# Patient Record
Sex: Female | Born: 1940 | Race: White | Hispanic: No | Marital: Married | Smoking: Never smoker
Health system: Southern US, Community
[De-identification: ages and names within clinical notes are randomized; demographics above are authoritative.]

## PROBLEM LIST (undated history)

## (undated) DIAGNOSIS — H269 Unspecified cataract: Secondary | ICD-10-CM

## (undated) HISTORY — PX: TRACHEOSTOMY: SUR1362

## (undated) HISTORY — PX: BREAST SURGERY: SHX581

## (undated) HISTORY — PX: ABDOMINAL HYSTERECTOMY: SHX81

---

## 2001-07-06 ENCOUNTER — Emergency Department (HOSPITAL_COMMUNITY): Admission: EM | Admit: 2001-07-06 | Discharge: 2001-07-06 | Payer: Self-pay | Admitting: *Deleted

## 2001-07-06 ENCOUNTER — Encounter: Payer: Self-pay | Admitting: *Deleted

## 2013-06-17 ENCOUNTER — Encounter (HOSPITAL_COMMUNITY): Payer: Self-pay | Admitting: Pharmacy Technician

## 2013-06-17 NOTE — Patient Instructions (Addendum)
Your procedure is scheduled on: 06/20/2013  Report to [facility] at  1000 AM.  Call this number if you have problems the morning of surgery: [phone]   Do not eat food or drink liquids :After Midnight.      Take these medicines the morning of surgery with A SIP OF WATER: none   Do not wear jewelry, make-up or nail polish.  Do not wear lotions, powders, or perfumes.   Do not shave 48 hours prior to surgery.  Do not bring valuables to the hospital.  Contacts, dentures or bridgework may not be worn into surgery.  Leave suitcase in the car. After surgery it may be brought to your room.  For patients admitted to the hospital, checkout time is 11:00 AM the day of discharge.   Patients discharged the day of surgery will not be allowed to drive home.  :     Please read over the following fact sheets that you were given: Coughing and Deep Breathing, Surgical Site Infection Prevention, Anesthesia Post-op Instructions and Care and Recovery After Surgery    Cataract A cataract is a clouding of the lens of the eye. When a lens becomes cloudy, vision is reduced based on the degree and nature of the clouding. Many cataracts reduce vision to some degree. Some cataracts make people more near-sighted as they develop. Other cataracts increase glare. Cataracts that are ignored and become worse can sometimes look white. The white color can be seen through the pupil. CAUSES   Aging. However, cataracts may occur at any age, even in newborns.   Certain drugs.   Trauma to the eye.   Certain diseases such as diabetes.   Specific eye diseases such as chronic inflammation inside the eye or a sudden attack of a rare form of glaucoma.   Inherited or acquired medical problems.  SYMPTOMS   Gradual, progressive drop in vision in the affected eye.   Severe, rapid visual loss. This most often happens when trauma is the cause.  DIAGNOSIS  To detect a cataract, an eye doctor examines the lens. Cataracts are  best diagnosed with an exam of the eyes with the pupils enlarged (dilated) by drops.  TREATMENT  For an early cataract, vision may improve by using different eyeglasses or stronger lighting. If that does not help your vision, surgery is the only effective treatment. A cataract needs to be surgically removed when vision loss interferes with your everyday activities, such as driving, reading, or watching TV. A cataract may also have to be removed if it prevents examination or treatment of another eye problem. Surgery removes the cloudy lens and usually replaces it with a substitute lens (intraocular lens, IOL).  At a time when both you and your doctor agree, the cataract will be surgically removed. If you have cataracts in both eyes, only one is usually removed at a time. This allows the operated eye to heal and be out of danger from any possible problems after surgery (such as infection or poor wound healing). In rare cases, a cataract may be doing damage to your eye. In these cases, your caregiver may advise surgical removal right away. The vast majority of people who have cataract surgery have better vision afterward. HOME CARE INSTRUCTIONS  If you are not planning surgery, you may be asked to do the following:  Use different eyeglasses.   Use stronger or brighter lighting.   Ask your eye doctor about reducing your medicine dose or changing medicines if it  is thought that a medicine caused your cataract. Changing medicines does not make the cataract go away on its own.   Become familiar with your surroundings. Poor vision can lead to injury. Avoid bumping into things on the affected side. You are at a higher risk for tripping or falling.   Exercise extreme care when driving or operating machinery.   Wear sunglasses if you are sensitive to bright light or experiencing problems with glare.  SEEK IMMEDIATE MEDICAL CARE IF:   You have a worsening or sudden vision loss.   You notice redness,  swelling, or increasing pain in the eye.   You have a fever.  Document Released: 12/05/2005 Document Revised: 11/24/2011 Document Reviewed: 07/29/2011 [facility] Patient Information 2012 [place].PATIENT INSTRUCTIONS POST-ANESTHESIA  IMMEDIATELY FOLLOWING SURGERY:  Do not drive or operate machinery for the first twenty four hours after surgery.  Do not make any important decisions for twenty four hours after surgery or while taking narcotic pain medications or sedatives.  If you develop intractable nausea and vomiting or a severe headache please notify your doctor immediately.  FOLLOW-UP:  Please make an appointment with your surgeon as instructed. You do not need to follow up with anesthesia unless specifically instructed to do so.  WOUND CARE INSTRUCTIONS (if applicable):  Keep a dry clean dressing on the anesthesia/puncture wound site if there is drainage.  Once the wound has quit draining you may leave it open to air.  Generally you should leave the bandage intact for twenty four hours unless there is drainage.  If the epidural site drains for more than 36-48 hours please call the anesthesia department.  QUESTIONS?:  Please feel free to call your physician or the hospital operator if you have any questions, and they will be happy to assist you.

## 2013-06-18 ENCOUNTER — Encounter (HOSPITAL_COMMUNITY): Payer: Self-pay

## 2013-06-18 ENCOUNTER — Encounter (HOSPITAL_COMMUNITY)
Admission: RE | Admit: 2013-06-18 | Discharge: 2013-06-18 | Disposition: A | Discharge status: Home / Self Care | Payer: Medicare Other | Source: Ambulatory Visit | Attending: Ophthalmology | Admitting: Ophthalmology

## 2013-06-18 HISTORY — DX: Unspecified cataract: H26.9

## 2013-06-18 LAB — BASIC METABOLIC PANEL
BUN: 17 mg/dL (ref 6–23)
CO2: 31 mEq/L (ref 19–32)
Calcium: 9.7 mg/dL (ref 8.4–10.5)
Chloride: 104 mEq/L (ref 96–112)
Creatinine, Ser: 0.79 mg/dL (ref 0.50–1.10)
GFR calc Af Amer: >90 mL/min (ref >90)
GFR calc non Af Amer: 81 mL/min — ABNORMAL LOW (ref >90)
Glucose, Bld: 119 mg/dL — ABNORMAL HIGH (ref 70–99)
Potassium: 4.2 mEq/L (ref 3.5–5.1)
Sodium: 143 mEq/L (ref 135–145)

## 2013-06-18 LAB — HEMOGLOBIN AND HEMATOCRIT, BLOOD
HCT: 40.3 % (ref 36.0–46.0)
Hemoglobin: 13.4 g/dL (ref 12.0–15.0)

## 2013-06-19 MED ORDER — TETRACAINE HCL 0.5 % OP SOLN
OPHTHALMIC | Status: AC
  Filled 2013-06-19: qty 2

## 2013-06-19 MED ORDER — LIDOCAINE HCL (PF) 1 % IJ SOLN
INTRAMUSCULAR | Status: AC
  Filled 2013-06-19: qty 2

## 2013-06-19 MED ORDER — CYCLOPENTOLATE-PHENYLEPHRINE 0.2-1 % OP SOLN
OPHTHALMIC | Status: AC
  Filled 2013-06-19: qty 2

## 2013-06-19 MED ORDER — NEOMYCIN-POLYMYXIN-DEXAMETH 3.5-10000-0.1 OP OINT
TOPICAL_OINTMENT | OPHTHALMIC | Status: AC
  Filled 2013-06-19: qty 3.5

## 2013-06-19 MED ORDER — LIDOCAINE HCL 3.5 % OP GEL
OPHTHALMIC | Status: AC
  Filled 2013-06-19: qty 5

## 2013-06-20 ENCOUNTER — Encounter (HOSPITAL_COMMUNITY)
Admission: RE | Disposition: A | Discharge status: Home / Self Care | Payer: Self-pay | Source: Ambulatory Visit | Attending: Ophthalmology

## 2013-06-20 ENCOUNTER — Ambulatory Visit (HOSPITAL_COMMUNITY): Payer: Medicare Other | Admitting: Anesthesiology

## 2013-06-20 ENCOUNTER — Encounter (HOSPITAL_COMMUNITY): Payer: Self-pay | Admitting: *Deleted

## 2013-06-20 ENCOUNTER — Ambulatory Visit (HOSPITAL_COMMUNITY)
Admission: RE | Admit: 2013-06-20 | Discharge: 2013-06-20 | Disposition: A | Discharge status: Home / Self Care | Payer: Medicare Other | Source: Ambulatory Visit | Attending: Ophthalmology | Admitting: Ophthalmology

## 2013-06-20 ENCOUNTER — Encounter (HOSPITAL_COMMUNITY): Payer: Self-pay | Admitting: Anesthesiology

## 2013-06-20 DIAGNOSIS — Z01810 Encounter for preprocedural cardiovascular examination: Secondary | ICD-10-CM | POA: Insufficient documentation

## 2013-06-20 DIAGNOSIS — H2510 Age-related nuclear cataract, unspecified eye: Secondary | ICD-10-CM | POA: Insufficient documentation

## 2013-06-20 DIAGNOSIS — Z01812 Encounter for preprocedural laboratory examination: Secondary | ICD-10-CM | POA: Insufficient documentation

## 2013-06-20 HISTORY — PX: CATARACT EXTRACTION W/PHACO: SHX586

## 2013-06-20 SURGERY — PHACOEMULSIFICATION, CATARACT, WITH IOL INSERTION
Anesthesia: Monitor Anesthesia Care | Site: Eye | Laterality: Right | Wound class: Clean

## 2013-06-20 MED ORDER — PHENYLEPHRINE HCL 2.5 % OP SOLN
OPHTHALMIC | Status: AC
  Filled 2013-06-20: qty 15

## 2013-06-20 MED ORDER — EPINEPHRINE HCL 1 MG/ML IJ SOLN
INTRAOCULAR | Status: DC | PRN
  Administered 2013-06-20: 11:00:00

## 2013-06-20 MED ORDER — NEOMYCIN-POLYMYXIN-DEXAMETH 0.1 % OP OINT
TOPICAL_OINTMENT | OPHTHALMIC | Status: DC | PRN
  Administered 2013-06-20: 1 via OPHTHALMIC

## 2013-06-20 MED ORDER — LACTATED RINGERS IV SOLN
INTRAVENOUS | Status: DC
  Administered 2013-06-20: 1000 mL via INTRAVENOUS

## 2013-06-20 MED ORDER — PROVISC 10 MG/ML IO SOLN
INTRAOCULAR | Status: DC | PRN
  Administered 2013-06-20: 8.5 mg via INTRAOCULAR

## 2013-06-20 MED ORDER — PHENYLEPHRINE HCL 2.5 % OP SOLN
1.0000 [drp] | OPHTHALMIC | Status: AC
  Administered 2013-06-20 (×3): 1 [drp] via OPHTHALMIC

## 2013-06-20 MED ORDER — EPINEPHRINE HCL 1 MG/ML IJ SOLN
INTRAMUSCULAR | Status: AC
  Filled 2013-06-20: qty 1

## 2013-06-20 MED ORDER — MIDAZOLAM HCL 2 MG/2ML IJ SOLN
INTRAMUSCULAR | Status: AC
  Filled 2013-06-20: qty 2

## 2013-06-20 MED ORDER — MIDAZOLAM HCL 2 MG/2ML IJ SOLN
1.0000 mg | INTRAMUSCULAR | Status: DC | PRN
  Administered 2013-06-20: 2 mg via INTRAVENOUS

## 2013-06-20 MED ORDER — LIDOCAINE HCL 3.5 % OP GEL
1.0000 "application " | Freq: Once | OPHTHALMIC | Status: AC
  Administered 2013-06-20: 1 via OPHTHALMIC

## 2013-06-20 MED ORDER — BSS IO SOLN
INTRAOCULAR | Status: DC | PRN
  Administered 2013-06-20: 15 mL via INTRAOCULAR

## 2013-06-20 MED ORDER — CYCLOPENTOLATE-PHENYLEPHRINE 0.2-1 % OP SOLN
1.0000 [drp] | OPHTHALMIC | Status: AC
  Administered 2013-06-20 (×3): 1 [drp] via OPHTHALMIC

## 2013-06-20 MED ORDER — LIDOCAINE HCL (PF) 1 % IJ SOLN
INTRAMUSCULAR | Status: DC | PRN
  Administered 2013-06-20: .4 mL

## 2013-06-20 MED ORDER — POVIDONE-IODINE 5 % OP SOLN
OPHTHALMIC | Status: DC | PRN
  Administered 2013-06-20: 1 via OPHTHALMIC

## 2013-06-20 MED ORDER — TETRACAINE HCL 0.5 % OP SOLN
1.0000 [drp] | OPHTHALMIC | Status: AC
  Administered 2013-06-20 (×3): 1 [drp] via OPHTHALMIC

## 2013-06-20 SURGICAL SUPPLY — 32 items
CAPSULAR TENSION RING-AMO (OPHTHALMIC RELATED) IMPLANT
CLOTH BEACON ORANGE TIMEOUT ST (SAFETY) ×2 IMPLANT
EYE SHIELD UNIVERSAL CLEAR (GAUZE/BANDAGES/DRESSINGS) ×2 IMPLANT
GLOVE BIO SURGEON STRL SZ 6.5 (GLOVE) IMPLANT
GLOVE BIOGEL PI IND STRL 6.5 (GLOVE) IMPLANT
GLOVE BIOGEL PI IND STRL 7.0 (GLOVE) ×2 IMPLANT
GLOVE BIOGEL PI IND STRL 7.5 (GLOVE) IMPLANT
GLOVE BIOGEL PI INDICATOR 6.5 (GLOVE)
GLOVE BIOGEL PI INDICATOR 7.0 (GLOVE) ×2
GLOVE BIOGEL PI INDICATOR 7.5 (GLOVE)
GLOVE ECLIPSE 6.5 STRL STRAW (GLOVE) IMPLANT
GLOVE ECLIPSE 7.0 STRL STRAW (GLOVE) IMPLANT
GLOVE ECLIPSE 7.5 STRL STRAW (GLOVE) IMPLANT
GLOVE EXAM NITRILE LRG STRL (GLOVE) IMPLANT
GLOVE EXAM NITRILE MD LF STRL (GLOVE) IMPLANT
GLOVE SKINSENSE NS SZ6.5 (GLOVE)
GLOVE SKINSENSE NS SZ7.0 (GLOVE)
GLOVE SKINSENSE STRL SZ6.5 (GLOVE) IMPLANT
GLOVE SKINSENSE STRL SZ7.0 (GLOVE) IMPLANT
KIT VITRECTOMY (OPHTHALMIC RELATED) IMPLANT
PAD ARMBOARD 7.5X6 YLW CONV (MISCELLANEOUS) ×2 IMPLANT
PROC W NO LENS (INTRAOCULAR LENS)
PROC W SPEC LENS (INTRAOCULAR LENS)
PROCESS W NO LENS (INTRAOCULAR LENS) IMPLANT
PROCESS W SPEC LENS (INTRAOCULAR LENS) IMPLANT
RING MALYGIN (MISCELLANEOUS) IMPLANT
SIGHTPATH CAT PROC W REG LENS (Ophthalmic Related) ×2 IMPLANT
SYR TB 1ML LL NO SAFETY (SYRINGE) ×2 IMPLANT
TAPE SURG TRANSPORE 1 IN (GAUZE/BANDAGES/DRESSINGS) ×1 IMPLANT
TAPE SURGICAL TRANSPORE 1 IN (GAUZE/BANDAGES/DRESSINGS) ×1
VISCOELASTIC ADDITIONAL (OPHTHALMIC RELATED) IMPLANT
WATER STERILE IRR 250ML POUR (IV SOLUTION) ×2 IMPLANT

## 2013-06-20 NOTE — Anesthesia Postprocedure Evaluation (Signed)
  Anesthesia Post-op Note  Patient: Rachael Rivas  Procedure(s) Performed: Procedure(s) with comments: CATARACT EXTRACTION PHACO AND INTRAOCULAR LENS PLACEMENT (IOC) (Right) - CDE: 16.48  Patient Location: PACU and Short Stay  Anesthesia Type:MAC  Level of Consciousness: awake, alert  and oriented  Airway and Oxygen Therapy: Patient Spontanous Breathing  Post-op Pain: none  Post-op Assessment: Post-op Vital signs reviewed, Patient's Cardiovascular Status Stable, Respiratory Function Stable, Patent Airway and No signs of Nausea or vomiting  Post-op Vital Signs: Reviewed and stable  Complications: No apparent anesthesia complications

## 2013-06-20 NOTE — Transfer of Care (Signed)
Immediate Anesthesia Transfer of Care Note  Patient: Rachael Rivas  Procedure(s) Performed: Procedure(s) with comments: CATARACT EXTRACTION PHACO AND INTRAOCULAR LENS PLACEMENT (IOC) (Right) - CDE: 16.48  Patient Location: PACU and Short Stay  Anesthesia Type:MAC  Level of Consciousness: awake, alert  and oriented  Airway & Oxygen Therapy: Patient Spontanous Breathing  Post-op Assessment: Report given to PACU RN  Post vital signs: Reviewed  Complications: No apparent anesthesia complications

## 2013-06-20 NOTE — Op Note (Signed)
Date of Admission: 06/20/2013  Date of Surgery: 06/20/2013  Pre-Op Dx: Cataract  Right  Eye  Post-Op Dx: Nuclear Cataract  Right  Eye,  Dx Code 366.16  Surgeon: Gemma Payor, M.D.  Assistants: None  Anesthesia: Topical with MAC  Indications: Painless, progressive loss of vision with compromise of daily activities.  Surgery: Cataract Extraction with Intraocular lens Implant Right Eye  Discription: The patient had dilating drops and viscous lidocaine placed into the left eye in the pre-op holding area. After transfer to the operating room, a time out was performed. The patient was then prepped and draped. Beginning with a 75 degree blade a paracentesis port was made at the surgeon's 2 o'clock position. The anterior chamber was then filled with 1% non-preserved lidocaine. This was followed by filling the anterior chamber with Provisc. A bent cystatome needle was used to create a continuous tear capsulotomy. Hydrodissection was performed with balanced salt solution on a Fine canula. The lens nucleus was then removed using the phacoemulsification handpiece. Residual cortex was removed with the I&A handpiece. The anterior chamber and capsular bag were refilled with Provisc. A posterior chamber intraocular lens was placed into the capsular bag with it's injector. The implant was positioned with the Kuglan hook. The Provisc was then removed from the anterior chamber and capsular bag with the I&A handpiece. Stromal hydration of the main incision and paracentesis port was performed with BSS on a Fine canula. The wounds were tested for leak which was negative. The patient tolerated the procedure well. There were no operative complications. The patient was then transferred to the recovery room in stable condition.  Complications: None  Specimen: None  EBL: None  Prosthetic device: B&L enVista, MX60, power 18.0D, #[id number].

## 2013-06-20 NOTE — H&P (Signed)
I have reviewed the H&P, the patient was re-examined, and I have identified no interval changes in medical condition and plan of care since the history and physical of record  

## 2013-06-20 NOTE — Anesthesia Preprocedure Evaluation (Signed)
Anesthesia Evaluation  Patient identified by MRN, date of birth, ID band Patient awake    Reviewed: Allergy & Precautions, H&P , NPO status , Patient's Chart, lab work & pertinent test results  Airway Mallampati: II      Dental  (+) Teeth Intact   Pulmonary neg pulmonary ROS,  breath sounds clear to auscultation        Cardiovascular negative cardio ROS  Rhythm:Regular Rate:Normal     Neuro/Psych    GI/Hepatic negative GI ROS,   Endo/Other    Renal/GU      Musculoskeletal   Abdominal   Peds  Hematology   Anesthesia Other Findings   Reproductive/Obstetrics                           Anesthesia Physical Anesthesia Plan  ASA: I  Anesthesia Plan: MAC   Post-op Pain Management:    Induction: Intravenous  Airway Management Planned: Nasal Cannula  Additional Equipment:   Intra-op Plan:   Post-operative Plan:   Informed Consent: I have reviewed the patients History and Physical, chart, labs and discussed the procedure including the risks, benefits and alternatives for the proposed anesthesia with the patient or authorized representative who has indicated his/her understanding and acceptance.     Plan Discussed with:   Anesthesia Plan Comments:         Anesthesia Quick Evaluation  

## 2013-06-24 ENCOUNTER — Encounter (HOSPITAL_COMMUNITY): Payer: Self-pay | Admitting: Ophthalmology

## 2014-05-21 ENCOUNTER — Encounter (HOSPITAL_COMMUNITY): Payer: Self-pay

## 2014-05-21 NOTE — Patient Instructions (Signed)
Your procedure is scheduled on: 6/8/215  Report to [facility] at   1:00   PM.  Call this number if you have problems the morning of surgery: [phone]   Do not eat food or drink liquids :After Midnight.      Take these medicines the morning of surgery with A SIP OF WATER: none   Do not wear jewelry, make-up or nail polish.  Do not wear lotions, powders, or perfumes.   Do not shave 48 hours prior to surgery.  Do not bring valuables to the hospital.  Contacts, dentures or bridgework may not be worn into surgery.  Leave suitcase in the car. After surgery it may be brought to your room.  For patients admitted to the hospital, checkout time is 11:00 AM the day of discharge.   Patients discharged the day of surgery will not be allowed to drive home.  :     Please read over the following fact sheets that you were given: Coughing and Deep Breathing, Surgical Site Infection Prevention, Anesthesia Post-op Instructions and Care and Recovery After Surgery    Cataract A cataract is a clouding of the lens of the eye. When a lens becomes cloudy, vision is reduced based on the degree and nature of the clouding. Many cataracts reduce vision to some degree. Some cataracts make people more near-sighted as they develop. Other cataracts increase glare. Cataracts that are ignored and become worse can sometimes look white. The white color can be seen through the pupil. CAUSES   Aging. However, cataracts may occur at any age, even in newborns.   Certain drugs.   Trauma to the eye.   Certain diseases such as diabetes.   Specific eye diseases such as chronic inflammation inside the eye or a sudden attack of a rare form of glaucoma.   Inherited or acquired medical problems.  SYMPTOMS   Gradual, progressive drop in vision in the affected eye.   Severe, rapid visual loss. This most often happens when trauma is the cause.  DIAGNOSIS  To detect a cataract, an eye doctor examines the lens. Cataracts are  best diagnosed with an exam of the eyes with the pupils enlarged (dilated) by drops.  TREATMENT  For an early cataract, vision may improve by using different eyeglasses or stronger lighting. If that does not help your vision, surgery is the only effective treatment. A cataract needs to be surgically removed when vision loss interferes with your everyday activities, such as driving, reading, or watching TV. A cataract may also have to be removed if it prevents examination or treatment of another eye problem. Surgery removes the cloudy lens and usually replaces it with a substitute lens (intraocular lens, IOL).  At a time when both you and your doctor agree, the cataract will be surgically removed. If you have cataracts in both eyes, only one is usually removed at a time. This allows the operated eye to heal and be out of danger from any possible problems after surgery (such as infection or poor wound healing). In rare cases, a cataract may be doing damage to your eye. In these cases, your caregiver may advise surgical removal right away. The vast majority of people who have cataract surgery have better vision afterward. HOME CARE INSTRUCTIONS  If you are not planning surgery, you may be asked to do the following:  Use different eyeglasses.   Use stronger or brighter lighting.   Ask your eye doctor about reducing your medicine dose or changing  medicines if it is thought that a medicine caused your cataract. Changing medicines does not make the cataract go away on its own.   Become familiar with your surroundings. Poor vision can lead to injury. Avoid bumping into things on the affected side. You are at a higher risk for tripping or falling.   Exercise extreme care when driving or operating machinery.   Wear sunglasses if you are sensitive to bright light or experiencing problems with glare.  SEEK IMMEDIATE MEDICAL CARE IF:   You have a worsening or sudden vision loss.   You notice redness,  swelling, or increasing pain in the eye.   You have a fever.  Document Released: 12/05/2005 Document Revised: 11/24/2011 Document Reviewed: 07/29/2011 [facility] Patient Information 2012 [place].PATIENT INSTRUCTIONS POST-ANESTHESIA  IMMEDIATELY FOLLOWING SURGERY:  Do not drive or operate machinery for the first twenty four hours after surgery.  Do not make any important decisions for twenty four hours after surgery or while taking narcotic pain medications or sedatives.  If you develop intractable nausea and vomiting or a severe headache please notify your doctor immediately.  FOLLOW-UP:  Please make an appointment with your surgeon as instructed. You do not need to follow up with anesthesia unless specifically instructed to do so.  WOUND CARE INSTRUCTIONS (if applicable):  Keep a dry clean dressing on the anesthesia/puncture wound site if there is drainage.  Once the wound has quit draining you may leave it open to air.  Generally you should leave the bandage intact for twenty four hours unless there is drainage.  If the epidural site drains for more than 36-48 hours please call the anesthesia department.  QUESTIONS?:  Please feel free to call your physician or the hospital operator if you have any questions, and they will be happy to assist you.

## 2014-05-22 ENCOUNTER — Encounter (HOSPITAL_COMMUNITY): Payer: Self-pay

## 2014-05-22 ENCOUNTER — Other Ambulatory Visit: Payer: Self-pay

## 2014-05-22 ENCOUNTER — Encounter (HOSPITAL_COMMUNITY)
Admission: RE | Admit: 2014-05-22 | Discharge: 2014-05-22 | Disposition: A | Discharge status: Home / Self Care | Payer: Medicare Other | Source: Ambulatory Visit | Attending: Ophthalmology | Admitting: Ophthalmology

## 2014-05-22 DIAGNOSIS — Z01810 Encounter for preprocedural cardiovascular examination: Secondary | ICD-10-CM | POA: Insufficient documentation

## 2014-05-22 DIAGNOSIS — Z01812 Encounter for preprocedural laboratory examination: Secondary | ICD-10-CM | POA: Insufficient documentation

## 2014-05-22 LAB — BASIC METABOLIC PANEL
BUN: 22 mg/dL (ref 6–23)
CALCIUM: 9.4 mg/dL (ref 8.4–10.5)
CHLORIDE: 103 meq/L (ref 96–112)
CO2: 28 mEq/L (ref 19–32)
CREATININE: 0.85 mg/dL (ref 0.50–1.10)
GFR calc non Af Amer: 66 mL/min — ABNORMAL LOW (ref >90)
GFR, EST AFRICAN AMERICAN: 77 mL/min — AB (ref >90)
Glucose, Bld: 98 mg/dL (ref 70–99)
Potassium: 4.5 mEq/L (ref 3.7–5.3)
Sodium: 143 mEq/L (ref 137–147)

## 2014-05-22 LAB — HEMOGLOBIN AND HEMATOCRIT, BLOOD
HCT: 39.2 % (ref 36.0–46.0)
HEMOGLOBIN: 13.1 g/dL (ref 12.0–15.0)

## 2014-05-22 NOTE — Pre-Procedure Instructions (Signed)
Pateint given information to sign up for my chart at home. 

## 2014-05-23 MED ORDER — TETRACAINE HCL 0.5 % OP SOLN
OPHTHALMIC | Status: AC
  Filled 2014-05-23: qty 2

## 2014-05-23 MED ORDER — LIDOCAINE HCL (PF) 1 % IJ SOLN
INTRAMUSCULAR | Status: AC
  Filled 2014-05-23: qty 2

## 2014-05-23 MED ORDER — PHENYLEPHRINE HCL 2.5 % OP SOLN
OPHTHALMIC | Status: AC
  Filled 2014-05-23: qty 15

## 2014-05-23 MED ORDER — CYCLOPENTOLATE-PHENYLEPHRINE OP SOLN OPTIME - NO CHARGE
OPHTHALMIC | Status: AC
  Filled 2014-05-23: qty 2

## 2014-05-23 MED ORDER — LIDOCAINE HCL 3.5 % OP GEL
OPHTHALMIC | Status: AC
  Filled 2014-05-23: qty 1

## 2014-05-23 MED ORDER — NEOMYCIN-POLYMYXIN-DEXAMETH 3.5-10000-0.1 OP SUSP
OPHTHALMIC | Status: AC
  Filled 2014-05-23: qty 5

## 2014-05-26 ENCOUNTER — Ambulatory Visit (HOSPITAL_COMMUNITY)
Admission: RE | Admit: 2014-05-26 | Discharge: 2014-05-26 | Disposition: A | Discharge status: Home / Self Care | Payer: Medicare Other | Source: Ambulatory Visit | Attending: Ophthalmology | Admitting: Ophthalmology

## 2014-05-26 ENCOUNTER — Ambulatory Visit (HOSPITAL_COMMUNITY): Payer: Medicare Other | Admitting: Anesthesiology

## 2014-05-26 ENCOUNTER — Encounter (HOSPITAL_COMMUNITY)
Admission: RE | Disposition: A | Discharge status: Home / Self Care | Payer: Self-pay | Source: Ambulatory Visit | Attending: Ophthalmology

## 2014-05-26 ENCOUNTER — Encounter (HOSPITAL_COMMUNITY): Payer: Self-pay

## 2014-05-26 ENCOUNTER — Encounter (HOSPITAL_COMMUNITY): Payer: Medicare Other | Admitting: Anesthesiology

## 2014-05-26 DIAGNOSIS — H2510 Age-related nuclear cataract, unspecified eye: Secondary | ICD-10-CM | POA: Insufficient documentation

## 2014-05-26 HISTORY — PX: CATARACT EXTRACTION W/PHACO: SHX586

## 2014-05-26 SURGERY — PHACOEMULSIFICATION, CATARACT, WITH IOL INSERTION
Anesthesia: Monitor Anesthesia Care | Site: Eye | Laterality: Left

## 2014-05-26 MED ORDER — MIDAZOLAM HCL 2 MG/2ML IJ SOLN
1.0000 mg | INTRAMUSCULAR | Status: DC | PRN
  Administered 2014-05-26: 2 mg via INTRAVENOUS

## 2014-05-26 MED ORDER — EPINEPHRINE HCL 1 MG/ML IJ SOLN
INTRAOCULAR | Status: DC | PRN
  Administered 2014-05-26: 11:00:00

## 2014-05-26 MED ORDER — LIDOCAINE HCL 3.5 % OP GEL
1.0000 "application " | Freq: Once | OPHTHALMIC | Status: AC
  Administered 2014-05-26: 1 via OPHTHALMIC

## 2014-05-26 MED ORDER — LIDOCAINE HCL (PF) 1 % IJ SOLN
INTRAMUSCULAR | Status: DC | PRN
  Administered 2014-05-26: .5 mL

## 2014-05-26 MED ORDER — LIDOCAINE 3.5 % OP GEL OPTIME - NO CHARGE
OPHTHALMIC | Status: DC | PRN
  Administered 2014-05-26: 2 [drp] via OPHTHALMIC

## 2014-05-26 MED ORDER — MIDAZOLAM HCL 2 MG/2ML IJ SOLN
INTRAMUSCULAR | Status: AC
  Filled 2014-05-26: qty 2

## 2014-05-26 MED ORDER — PROVISC 10 MG/ML IO SOLN
INTRAOCULAR | Status: DC | PRN
  Administered 2014-05-26: 0.85 mL via INTRAOCULAR

## 2014-05-26 MED ORDER — NEOMYCIN-POLYMYXIN-DEXAMETH 3.5-10000-0.1 OP SUSP
OPHTHALMIC | Status: DC | PRN
  Administered 2014-05-26: 2 [drp] via OPHTHALMIC

## 2014-05-26 MED ORDER — FENTANYL CITRATE 0.05 MG/ML IJ SOLN
25.0000 ug | INTRAMUSCULAR | Status: AC
  Administered 2014-05-26 (×2): 25 ug via INTRAVENOUS

## 2014-05-26 MED ORDER — PHENYLEPHRINE HCL 2.5 % OP SOLN
1.0000 [drp] | OPHTHALMIC | Status: AC
  Administered 2014-05-26 (×3): 1 [drp] via OPHTHALMIC

## 2014-05-26 MED ORDER — EPINEPHRINE HCL 1 MG/ML IJ SOLN
INTRAMUSCULAR | Status: AC
  Filled 2014-05-26: qty 1

## 2014-05-26 MED ORDER — POVIDONE-IODINE 5 % OP SOLN
OPHTHALMIC | Status: DC | PRN
  Administered 2014-05-26: 1 via OPHTHALMIC

## 2014-05-26 MED ORDER — LACTATED RINGERS IV SOLN
INTRAVENOUS | Status: DC
  Administered 2014-05-26: 10:00:00 via INTRAVENOUS

## 2014-05-26 MED ORDER — FENTANYL CITRATE 0.05 MG/ML IJ SOLN: 25.0000 ug | INTRAMUSCULAR | Status: DC | PRN

## 2014-05-26 MED ORDER — BSS IO SOLN
INTRAOCULAR | Status: DC | PRN
  Administered 2014-05-26: 15 mL via INTRAOCULAR

## 2014-05-26 MED ORDER — ONDANSETRON HCL 4 MG/2ML IJ SOLN: 4.0000 mg | Freq: Once | INTRAMUSCULAR | Status: DC | PRN

## 2014-05-26 MED ORDER — CYCLOPENTOLATE-PHENYLEPHRINE 0.2-1 % OP SOLN
1.0000 [drp] | OPHTHALMIC | Status: AC
  Administered 2014-05-26 (×3): 1 [drp] via OPHTHALMIC

## 2014-05-26 MED ORDER — TETRACAINE HCL 0.5 % OP SOLN
1.0000 [drp] | OPHTHALMIC | Status: AC
  Administered 2014-05-26 (×3): 1 [drp] via OPHTHALMIC

## 2014-05-26 MED ORDER — FENTANYL CITRATE 0.05 MG/ML IJ SOLN
INTRAMUSCULAR | Status: AC
  Filled 2014-05-26: qty 2

## 2014-05-26 SURGICAL SUPPLY — 34 items
CAPSULAR TENSION RING-AMO (OPHTHALMIC RELATED) IMPLANT
CLOTH BEACON ORANGE TIMEOUT ST (SAFETY) ×3 IMPLANT
EYE SHIELD UNIVERSAL CLEAR (GAUZE/BANDAGES/DRESSINGS) ×3 IMPLANT
GLOVE BIO SURGEON STRL SZ 6.5 (GLOVE) IMPLANT
GLOVE BIO SURGEONS STRL SZ 6.5 (GLOVE)
GLOVE BIOGEL PI IND STRL 6.5 (GLOVE) IMPLANT
GLOVE BIOGEL PI IND STRL 7.0 (GLOVE) IMPLANT
GLOVE BIOGEL PI IND STRL 7.5 (GLOVE) IMPLANT
GLOVE BIOGEL PI INDICATOR 6.5 (GLOVE)
GLOVE BIOGEL PI INDICATOR 7.0 (GLOVE)
GLOVE BIOGEL PI INDICATOR 7.5 (GLOVE)
GLOVE ECLIPSE 6.5 STRL STRAW (GLOVE) IMPLANT
GLOVE ECLIPSE 7.0 STRL STRAW (GLOVE) IMPLANT
GLOVE ECLIPSE 7.5 STRL STRAW (GLOVE) IMPLANT
GLOVE EXAM NITRILE LRG STRL (GLOVE) IMPLANT
GLOVE EXAM NITRILE MD LF STRL (GLOVE) IMPLANT
GLOVE SKINSENSE NS SZ6.5 (GLOVE)
GLOVE SKINSENSE NS SZ7.0 (GLOVE)
GLOVE SKINSENSE STRL SZ6.5 (GLOVE) IMPLANT
GLOVE SKINSENSE STRL SZ7.0 (GLOVE) IMPLANT
GLOVE SS N UNI LF 7.0 STRL (GLOVE) ×6 IMPLANT
KIT VITRECTOMY (OPHTHALMIC RELATED) IMPLANT
PAD ARMBOARD 7.5X6 YLW CONV (MISCELLANEOUS) ×3 IMPLANT
PROC W NO LENS (INTRAOCULAR LENS)
PROC W SPEC LENS (INTRAOCULAR LENS)
PROCESS W NO LENS (INTRAOCULAR LENS) IMPLANT
PROCESS W SPEC LENS (INTRAOCULAR LENS) IMPLANT
RING MALYGIN (MISCELLANEOUS) IMPLANT
SIGHTPATH CAT PROC W REG LENS (Ophthalmic Related) ×3 IMPLANT
SYR TB 1ML LL NO SAFETY (SYRINGE) ×3 IMPLANT
TAPE SURG TRANSPORE 1 IN (GAUZE/BANDAGES/DRESSINGS) ×1 IMPLANT
TAPE SURGICAL TRANSPORE 1 IN (GAUZE/BANDAGES/DRESSINGS) ×2
VISCOELASTIC ADDITIONAL (OPHTHALMIC RELATED) IMPLANT
WATER STERILE IRR 250ML POUR (IV SOLUTION) ×3 IMPLANT

## 2014-05-26 NOTE — Transfer of Care (Signed)
Immediate Anesthesia Transfer of Care Note  Patient: Rachael Rivas  Procedure(s) Performed: Procedure(s) with comments: CATARACT EXTRACTION PHACO AND INTRAOCULAR LENS PLACEMENT (IOC) (Left) - CDE 14.09  Patient Location: Short Stay  Anesthesia Type:MAC  Level of Consciousness: awake, alert , oriented and patient cooperative  Airway & Oxygen Therapy: Patient Spontanous Breathing  Post-op Assessment: Report given to PACU RN and Post -op Vital signs reviewed and stable  Post vital signs: Reviewed and stable  Complications: No apparent anesthesia complications

## 2014-05-26 NOTE — Anesthesia Postprocedure Evaluation (Signed)
  Anesthesia Post-op Note  Patient: Rachael Rivas  Procedure(s) Performed: Procedure(s) with comments: CATARACT EXTRACTION PHACO AND INTRAOCULAR LENS PLACEMENT (IOC) (Left) - CDE 14.09  Patient Location: Short Stay  Anesthesia Type:MAC  Level of Consciousness: awake, alert , oriented and patient cooperative  Airway and Oxygen Therapy: Patient Spontanous Breathing  Post-op Pain: none  Post-op Assessment: Post-op Vital signs reviewed, Patient's Cardiovascular Status Stable, Respiratory Function Stable, Patent Airway and Pain level controlled  Post-op Vital Signs: Reviewed and stable  Last Vitals:  Filed Vitals:   05/26/14 1030  BP: 120/71  Pulse:   Temp:   Resp: 34    Complications: No apparent anesthesia complications

## 2014-05-26 NOTE — H&P (Signed)
I have reviewed the H&P, the patient was re-examined, and I have identified no interval changes in medical condition and plan of care since the history and physical of record  

## 2014-05-26 NOTE — Anesthesia Preprocedure Evaluation (Signed)
Anesthesia Evaluation  Patient identified by MRN, date of birth, ID band Patient awake    Reviewed: Allergy & Precautions, H&P , NPO status , Patient's Chart, lab work & pertinent test results  Airway Mallampati: II      Dental  (+) Teeth Intact   Pulmonary neg pulmonary ROS,  breath sounds clear to auscultation        Cardiovascular negative cardio ROS  Rhythm:Regular Rate:Normal     Neuro/Psych    GI/Hepatic negative GI ROS,   Endo/Other    Renal/GU      Musculoskeletal   Abdominal   Peds  Hematology   Anesthesia Other Findings   Reproductive/Obstetrics                           Anesthesia Physical Anesthesia Plan  ASA: I  Anesthesia Plan: MAC   Post-op Pain Management:    Induction: Intravenous  Airway Management Planned: Nasal Cannula  Additional Equipment:   Intra-op Plan:   Post-operative Plan:   Informed Consent: I have reviewed the patients History and Physical, chart, labs and discussed the procedure including the risks, benefits and alternatives for the proposed anesthesia with the patient or authorized representative who has indicated his/her understanding and acceptance.     Plan Discussed with:   Anesthesia Plan Comments:         Anesthesia Quick Evaluation

## 2014-05-26 NOTE — Op Note (Signed)
Date of Admission: 05/26/2014  Date of Surgery: 05/26/2014  Pre-Op Dx: Cataract Left  Eye  Post-Op Dx: Nuclear Cataract  Left  Eye,  Dx Code 366.16  Surgeon: Gemma Payor, M.D.  Assistants: None  Anesthesia: Topical with MAC  Indications: Painless, progressive loss of vision with compromise of daily activities.  Surgery: Cataract Extraction with Intraocular lens Implant Left Eye  Discription: The patient had dilating drops and viscous lidocaine placed into the Left eye in the pre-op holding area. After transfer to the operating room, a time out was performed. The patient was then prepped and draped. Beginning with a 75 degree blade a paracentesis port was made at the surgeon's 2 o'clock position. The anterior chamber was then filled with 1% non-preserved lidocaine. This was followed by filling the anterior chamber with Provisc.  A 2.21mm keratome blade was used to make a clear corneal incision at the temporal limbus.  A bent cystatome needle was used to create a continuous tear capsulotomy. Hydrodissection was performed with balanced salt solution on a Fine canula. The lens nucleus was then removed using the phacoemulsification handpiece. Residual cortex was removed with the I&A handpiece. The anterior chamber and capsular bag were refilled with Provisc. A posterior chamber intraocular lens was placed into the capsular bag with it's injector. The implant was positioned with the Kuglan hook. The Provisc was then removed from the anterior chamber and capsular bag with the I&A handpiece. Stromal hydration of the main incision and paracentesis port was performed with BSS on a Fine canula. The wounds were tested for leak which was negative. The patient tolerated the procedure well. There were no operative complications. The patient was then transferred to the recovery room in stable condition.  Complications: None  Specimen: None  EBL: None  Prosthetic device: B&L enVista, MX60, power 20.5, SN  [id number].

## 2014-05-27 ENCOUNTER — Encounter (HOSPITAL_COMMUNITY): Payer: Self-pay | Admitting: Ophthalmology

## 2014-07-27 ENCOUNTER — Encounter (HOSPITAL_COMMUNITY): Payer: Self-pay | Admitting: Emergency Medicine

## 2014-07-27 ENCOUNTER — Emergency Department (HOSPITAL_COMMUNITY): Payer: Medicare Other

## 2014-07-27 ENCOUNTER — Inpatient Hospital Stay (HOSPITAL_COMMUNITY)
Admission: EM | Admit: 2014-07-27 | Discharge: 2014-08-19 | DRG: 004 | Disposition: E | Payer: Medicare Other | Attending: Internal Medicine | Admitting: Internal Medicine

## 2014-07-27 ENCOUNTER — Inpatient Hospital Stay (HOSPITAL_COMMUNITY): Payer: Medicare Other

## 2014-07-27 DIAGNOSIS — G935 Compression of brain: Secondary | ICD-10-CM | POA: Diagnosis present

## 2014-07-27 DIAGNOSIS — I6200 Nontraumatic subdural hemorrhage, unspecified: Secondary | ICD-10-CM | POA: Diagnosis present

## 2014-07-27 DIAGNOSIS — Z515 Encounter for palliative care: Secondary | ICD-10-CM

## 2014-07-27 DIAGNOSIS — I610 Nontraumatic intracerebral hemorrhage in hemisphere, subcortical: Secondary | ICD-10-CM

## 2014-07-27 DIAGNOSIS — J9600 Acute respiratory failure, unspecified whether with hypoxia or hypercapnia: Secondary | ICD-10-CM | POA: Diagnosis present

## 2014-07-27 DIAGNOSIS — E870 Hyperosmolality and hypernatremia: Secondary | ICD-10-CM

## 2014-07-27 DIAGNOSIS — E876 Hypokalemia: Secondary | ICD-10-CM | POA: Diagnosis present

## 2014-07-27 DIAGNOSIS — J9601 Acute respiratory failure with hypoxia: Secondary | ICD-10-CM

## 2014-07-27 DIAGNOSIS — N390 Urinary tract infection, site not specified: Secondary | ICD-10-CM | POA: Diagnosis not present

## 2014-07-27 DIAGNOSIS — I612 Nontraumatic intracerebral hemorrhage in hemisphere, unspecified: Secondary | ICD-10-CM

## 2014-07-27 DIAGNOSIS — Z66 Do not resuscitate: Secondary | ICD-10-CM | POA: Diagnosis present

## 2014-07-27 DIAGNOSIS — R4020 Unspecified coma: Secondary | ICD-10-CM | POA: Diagnosis present

## 2014-07-27 DIAGNOSIS — I10 Essential (primary) hypertension: Secondary | ICD-10-CM | POA: Diagnosis present

## 2014-07-27 DIAGNOSIS — G936 Cerebral edema: Secondary | ICD-10-CM | POA: Diagnosis present

## 2014-07-27 DIAGNOSIS — I160 Hypertensive urgency: Secondary | ICD-10-CM | POA: Diagnosis present

## 2014-07-27 DIAGNOSIS — I629 Nontraumatic intracranial hemorrhage, unspecified: Secondary | ICD-10-CM | POA: Diagnosis present

## 2014-07-27 DIAGNOSIS — R4182 Altered mental status, unspecified: Secondary | ICD-10-CM | POA: Diagnosis present

## 2014-07-27 DIAGNOSIS — S06369A Traumatic hemorrhage of cerebrum, unspecified, with loss of consciousness of unspecified duration, initial encounter: Secondary | ICD-10-CM

## 2014-07-27 DIAGNOSIS — Z23 Encounter for immunization: Secondary | ICD-10-CM | POA: Diagnosis not present

## 2014-07-27 DIAGNOSIS — S0689AA Other specified intracranial injury with loss of consciousness status unknown, initial encounter: Secondary | ICD-10-CM

## 2014-07-27 DIAGNOSIS — G8190 Hemiplegia, unspecified affecting unspecified side: Secondary | ICD-10-CM | POA: Diagnosis present

## 2014-07-27 DIAGNOSIS — I619 Nontraumatic intracerebral hemorrhage, unspecified: Secondary | ICD-10-CM | POA: Diagnosis present

## 2014-07-27 LAB — RAPID URINE DRUG SCREEN, HOSP PERFORMED
AMPHETAMINES: NOT DETECTED
BARBITURATES: NOT DETECTED
BENZODIAZEPINES: POSITIVE — AB
Cocaine: NOT DETECTED
Opiates: NOT DETECTED
Tetrahydrocannabinol: NOT DETECTED

## 2014-07-27 LAB — CBC WITH DIFFERENTIAL/PLATELET
BASOS PCT: 1 % (ref 0–1)
Basophils Absolute: 0.1 10*3/uL (ref 0.0–0.1)
Basophils Absolute: 0.1 10*3/uL (ref 0.0–0.1)
Basophils Relative: 0 % (ref 0–1)
EOS ABS: 0.3 10*3/uL (ref 0.0–0.7)
EOS PCT: 0 % (ref 0–5)
Eosinophils Absolute: 0.1 10*3/uL (ref 0.0–0.7)
Eosinophils Relative: 3 % (ref 0–5)
HEMATOCRIT: 40.7 % (ref 36.0–46.0)
HEMATOCRIT: 41.3 % (ref 36.0–46.0)
HEMOGLOBIN: 13.7 g/dL (ref 12.0–15.0)
HEMOGLOBIN: 14 g/dL (ref 12.0–15.0)
LYMPHS PCT: 8 % — AB (ref 12–46)
Lymphocytes Relative: 33 % (ref 12–46)
Lymphs Abs: 3.2 10*3/uL (ref 0.7–4.0)
Lymphs Abs: 1.9 10*3/uL (ref 0.7–4.0)
MCH: 30.6 pg (ref 26.0–34.0)
MCH: 30.8 pg (ref 26.0–34.0)
MCHC: 33.7 g/dL (ref 30.0–36.0)
MCHC: 33.9 g/dL (ref 30.0–36.0)
MCV: 91.1 fL (ref 78.0–100.0)
MCV: 91 fL (ref 78.0–100.0)
MONO ABS: 0.9 10*3/uL (ref 0.1–1.0)
MONOS PCT: 6 % (ref 3–12)
Monocytes Absolute: 1.4 10*3/uL — ABNORMAL HIGH (ref 0.1–1.0)
Monocytes Relative: 9 % (ref 3–12)
NEUTROS ABS: 5.4 10*3/uL (ref 1.7–7.7)
NEUTROS ABS: 21 10*3/uL — AB (ref 1.7–7.7)
Neutrophils Relative %: 54 % (ref 43–77)
Neutrophils Relative %: 86 % — ABNORMAL HIGH (ref 43–77)
Platelets: 208 10*3/uL (ref 150–400)
Platelets: 192 10*3/uL (ref 150–400)
RBC: 4.47 MIL/uL (ref 3.87–5.11)
RBC: 4.54 MIL/uL (ref 3.87–5.11)
RDW: 12.7 % (ref 11.5–15.5)
RDW: 12.7 % (ref 11.5–15.5)
WBC: 9.7 10*3/uL (ref 4.0–10.5)
WBC: 24.4 10*3/uL — ABNORMAL HIGH (ref 4.0–10.5)

## 2014-07-27 LAB — COMPREHENSIVE METABOLIC PANEL
ALT: 15 U/L (ref 0–35)
ALT: 17 U/L (ref 0–35)
ANION GAP: 14 (ref 5–15)
ANION GAP: 17 — AB (ref 5–15)
AST: 27 U/L (ref 0–37)
AST: 31 U/L (ref 0–37)
Albumin: 4.2 g/dL (ref 3.5–5.2)
Albumin: 4.4 g/dL (ref 3.5–5.2)
Alkaline Phosphatase: 119 U/L — ABNORMAL HIGH (ref 39–117)
Alkaline Phosphatase: 116 U/L (ref 39–117)
BUN: 18 mg/dL (ref 6–23)
BUN: 16 mg/dL (ref 6–23)
CALCIUM: 8.9 mg/dL (ref 8.4–10.5)
CO2: 23 mEq/L (ref 19–32)
CO2: 22 meq/L (ref 19–32)
CREATININE: 0.9 mg/dL (ref 0.50–1.10)
Calcium: 9.2 mg/dL (ref 8.4–10.5)
Chloride: 102 mEq/L (ref 96–112)
Chloride: 101 mEq/L (ref 96–112)
Creatinine, Ser: 0.78 mg/dL (ref 0.50–1.10)
GFR calc non Af Amer: 61 mL/min — ABNORMAL LOW (ref >90)
GFR, EST AFRICAN AMERICAN: 71 mL/min — AB (ref >90)
GFR, EST NON AFRICAN AMERICAN: 80 mL/min — AB (ref >90)
GLUCOSE: 210 mg/dL — AB (ref 70–99)
Glucose, Bld: 159 mg/dL — ABNORMAL HIGH (ref 70–99)
Potassium: 3.7 mEq/L (ref 3.7–5.3)
Potassium: 3.5 mEq/L — ABNORMAL LOW (ref 3.7–5.3)
Sodium: 139 mEq/L (ref 137–147)
Sodium: 140 mEq/L (ref 137–147)
TOTAL PROTEIN: 7.8 g/dL (ref 6.0–8.3)
Total Bilirubin: 0.4 mg/dL (ref 0.3–1.2)
Total Bilirubin: 1 mg/dL (ref 0.3–1.2)
Total Protein: 7.6 g/dL (ref 6.0–8.3)

## 2014-07-27 LAB — LIPID PANEL
CHOL/HDL RATIO: 4.3 ratio
Cholesterol: 215 mg/dL — ABNORMAL HIGH (ref 0–200)
HDL: 50 mg/dL (ref >39)
LDL CALC: 135 mg/dL — AB (ref 0–99)
TRIGLYCERIDES: 150 mg/dL — AB (ref <150)
VLDL: 30 mg/dL (ref 0–40)

## 2014-07-27 LAB — BLOOD GAS, ARTERIAL
Acid-base deficit: 0.9 mmol/L (ref 0.0–2.0)
BICARBONATE: 22.8 meq/L (ref 20.0–24.0)
Drawn by: 331761
FIO2: 0.3 %
MECHVT: 460 mL
O2 SAT: 97.7 %
PEEP: 5 cmH2O
PO2 ART: 91.1 mmHg (ref 80.0–100.0)
Patient temperature: 96.8
RATE: 24 resp/min
TCO2: 23.8 mmol/L (ref 0–100)
pCO2 arterial: 32.9 mmHg — ABNORMAL LOW (ref 35.0–45.0)
pH, Arterial: 7.45 (ref 7.350–7.450)

## 2014-07-27 LAB — CBG MONITORING, ED: Glucose-Capillary: 151 mg/dL — ABNORMAL HIGH (ref 70–99)

## 2014-07-27 LAB — TROPONIN I

## 2014-07-27 LAB — PROTIME-INR
INR: 1.06 (ref 0.00–1.49)
Prothrombin Time: 13.8 seconds (ref 11.6–15.2)

## 2014-07-27 LAB — I-STAT TROPONIN, ED: Troponin i, poc: 0 ng/mL (ref 0.00–0.08)

## 2014-07-27 LAB — TRIGLYCERIDES: TRIGLYCERIDES: 147 mg/dL (ref <150)

## 2014-07-27 LAB — MRSA PCR SCREENING: MRSA BY PCR: NEGATIVE

## 2014-07-27 LAB — MAGNESIUM: Magnesium: 2 mg/dL (ref 1.5–2.5)

## 2014-07-27 LAB — LACTIC ACID, PLASMA: LACTIC ACID, VENOUS: 2.5 mmol/L — AB (ref 0.5–2.2)

## 2014-07-27 LAB — APTT: aPTT: 24 seconds (ref 24–37)

## 2014-07-27 LAB — PHOSPHORUS: Phosphorus: 1.9 mg/dL — ABNORMAL LOW (ref 2.3–4.6)

## 2014-07-27 MED ORDER — MIDAZOLAM HCL 2 MG/2ML IJ SOLN
INTRAMUSCULAR | Status: AC
  Administered 2014-07-27: 2 mg via INTRAVENOUS
  Filled 2014-07-27: qty 2

## 2014-07-27 MED ORDER — PROPOFOL 10 MG/ML IV EMUL
5.0000 ug/kg/min | INTRAVENOUS | Status: DC
  Administered 2014-07-27: 20 ug/kg/min via INTRAVENOUS

## 2014-07-27 MED ORDER — SUCCINYLCHOLINE CHLORIDE 20 MG/ML IJ SOLN
INTRAMUSCULAR | Status: AC
  Administered 2014-07-27: 20 mg
  Filled 2014-07-27: qty 1

## 2014-07-27 MED ORDER — SODIUM CHLORIDE 0.9 % IV SOLN: 250.0000 mL | INTRAVENOUS | Status: DC | PRN

## 2014-07-27 MED ORDER — LIDOCAINE HCL (CARDIAC) 20 MG/ML IV SOLN
INTRAVENOUS | Status: AC
  Filled 2014-07-27: qty 5

## 2014-07-27 MED ORDER — SODIUM CHLORIDE 0.9 % IV SOLN
40.0000 ug/h | INTRAVENOUS | Status: DC
  Filled 2014-07-27: qty 50

## 2014-07-27 MED ORDER — ROCURONIUM BROMIDE 50 MG/5ML IV SOLN
INTRAVENOUS | Status: AC
  Filled 2014-07-27: qty 2

## 2014-07-27 MED ORDER — SODIUM CHLORIDE 3 % IV SOLN
INTRAVENOUS | Status: DC
  Administered 2014-07-27 – 2014-07-29 (×3): 100 mL/h via INTRAVENOUS
  Filled 2014-07-27 (×33): qty 500

## 2014-07-27 MED ORDER — NICARDIPINE HCL IN NACL 20-0.86 MG/200ML-% IV SOLN
5.0000 mg/h | Freq: Once | INTRAVENOUS | Status: DC
  Filled 2014-07-27: qty 200

## 2014-07-27 MED ORDER — FENTANYL CITRATE 0.05 MG/ML IJ SOLN
INTRAMUSCULAR | Status: AC
  Filled 2014-07-27: qty 50

## 2014-07-27 MED ORDER — CETYLPYRIDINIUM CHLORIDE 0.05 % MT LIQD
7.0000 mL | Freq: Four times a day (QID) | OROMUCOSAL | Status: DC
  Administered 2014-07-27 – 2014-07-31 (×14): 7 mL via OROMUCOSAL

## 2014-07-27 MED ORDER — ETOMIDATE 2 MG/ML IV SOLN
INTRAVENOUS | Status: AC
  Administered 2014-07-27: 20 mg
  Filled 2014-07-27: qty 20

## 2014-07-27 MED ORDER — ONDANSETRON HCL 4 MG/2ML IJ SOLN
INTRAMUSCULAR | Status: AC
  Administered 2014-07-27: 4 mg via INTRAVENOUS
  Filled 2014-07-27: qty 2

## 2014-07-27 MED ORDER — SODIUM CHLORIDE 3 % IV SOLN: INTRAVENOUS | Status: DC

## 2014-07-27 MED ORDER — PROPOFOL 10 MG/ML IV EMUL
INTRAVENOUS | Status: AC
  Filled 2014-07-27: qty 100

## 2014-07-27 MED ORDER — MIDAZOLAM HCL 2 MG/2ML IJ SOLN: 2.0000 mg | Freq: Once | INTRAMUSCULAR | Status: AC

## 2014-07-27 MED ORDER — PANTOPRAZOLE SODIUM 40 MG IV SOLR
40.0000 mg | Freq: Every day | INTRAVENOUS | Status: DC
  Administered 2014-07-27 – 2014-08-02 (×7): 40 mg via INTRAVENOUS
  Filled 2014-07-27 (×9): qty 40

## 2014-07-27 MED ORDER — INSULIN ASPART 100 UNIT/ML ~~LOC~~ SOLN
2.0000 [IU] | SUBCUTANEOUS | Status: DC
  Administered 2014-07-28 (×3): 2 [IU] via SUBCUTANEOUS
  Administered 2014-07-28: 4 [IU] via SUBCUTANEOUS
  Administered 2014-07-28 – 2014-07-29 (×4): 2 [IU] via SUBCUTANEOUS
  Administered 2014-07-29: 4 [IU] via SUBCUTANEOUS
  Administered 2014-07-29 – 2014-07-30 (×3): 2 [IU] via SUBCUTANEOUS
  Administered 2014-07-30 (×3): 4 [IU] via SUBCUTANEOUS
  Administered 2014-07-30 – 2014-07-31 (×2): 2 [IU] via SUBCUTANEOUS
  Administered 2014-07-31 (×2): 4 [IU] via SUBCUTANEOUS
  Administered 2014-07-31: 2 [IU] via SUBCUTANEOUS
  Administered 2014-07-31: 4 [IU] via SUBCUTANEOUS
  Administered 2014-07-31 (×2): 2 [IU] via SUBCUTANEOUS
  Administered 2014-08-01: 4 [IU] via SUBCUTANEOUS
  Administered 2014-08-01: 2 [IU] via SUBCUTANEOUS
  Administered 2014-08-01: 4 [IU] via SUBCUTANEOUS
  Administered 2014-08-02: 2 [IU] via SUBCUTANEOUS
  Administered 2014-08-02 (×2): 4 [IU] via SUBCUTANEOUS
  Administered 2014-08-02: 2 [IU] via SUBCUTANEOUS
  Administered 2014-08-02: 4 [IU] via SUBCUTANEOUS

## 2014-07-27 MED ORDER — FENTANYL CITRATE 0.05 MG/ML IJ SOLN
50.0000 ug | INTRAMUSCULAR | Status: DC | PRN
  Administered 2014-07-27 – 2014-08-02 (×5): 50 ug via INTRAVENOUS
  Filled 2014-07-27 (×5): qty 2

## 2014-07-27 MED ORDER — SODIUM CHLORIDE 0.9 % IV SOLN
2.0000 mg/h | INTRAVENOUS | Status: DC
  Filled 2014-07-27: qty 10

## 2014-07-27 MED ORDER — POTASSIUM PHOSPHATES 15 MMOLE/5ML IV SOLN
30.0000 mmol | Freq: Once | INTRAVENOUS | Status: AC
  Administered 2014-07-28: 30 mmol via INTRAVENOUS
  Filled 2014-07-27: qty 10

## 2014-07-27 MED ORDER — CHLORHEXIDINE GLUCONATE 0.12 % MT SOLN
15.0000 mL | Freq: Two times a day (BID) | OROMUCOSAL | Status: DC
  Administered 2014-07-27 – 2014-08-03 (×14): 15 mL via OROMUCOSAL
  Filled 2014-07-27 (×14): qty 15

## 2014-07-27 MED ORDER — NICARDIPINE HCL IN NACL 20-0.86 MG/200ML-% IV SOLN
3.0000 mg/h | INTRAVENOUS | Status: DC
  Administered 2014-07-27 – 2014-07-28 (×5): 5 mg/h via INTRAVENOUS
  Administered 2014-07-29 (×2): 10 mg/h via INTRAVENOUS
  Filled 2014-07-27 (×3): qty 200
  Filled 2014-07-27: qty 400
  Filled 2014-07-27: qty 200

## 2014-07-27 MED ORDER — PROPOFOL 10 MG/ML IV EMUL
0.0000 ug/kg/min | INTRAVENOUS | Status: DC
  Administered 2014-07-27: 20 ug/kg/min via INTRAVENOUS
  Administered 2014-07-28: 40 ug/kg/min via INTRAVENOUS
  Administered 2014-07-28 – 2014-07-29 (×2): 50 ug/kg/min via INTRAVENOUS
  Administered 2014-07-29: 45 ug/kg/min via INTRAVENOUS
  Administered 2014-07-29: 40 ug/kg/min via INTRAVENOUS
  Administered 2014-07-29: 50 ug/kg/min via INTRAVENOUS
  Administered 2014-07-29 – 2014-07-30 (×2): 45 ug/kg/min via INTRAVENOUS
  Administered 2014-07-30: 20 ug/kg/min via INTRAVENOUS
  Administered 2014-07-31: 30 ug/kg/min via INTRAVENOUS
  Administered 2014-07-31: 25 ug/kg/min via INTRAVENOUS
  Administered 2014-07-31: 30 ug/kg/min via INTRAVENOUS
  Administered 2014-07-31: 40 ug/kg/min via INTRAVENOUS
  Administered 2014-08-01 (×2): 30 ug/kg/min via INTRAVENOUS
  Administered 2014-08-01: 40 ug/kg/min via INTRAVENOUS
  Administered 2014-08-02 (×2): 30 ug/kg/min via INTRAVENOUS
  Administered 2014-08-02: 15 ug/kg/min via INTRAVENOUS
  Administered 2014-08-03: 30 ug/kg/min via INTRAVENOUS
  Filled 2014-07-27 (×20): qty 100

## 2014-07-27 NOTE — Procedures (Signed)
Central Venous Catheter Insertion Procedure Note Rachael Rivas [id number] 06-24-1941  Procedure: Insertion of Central Venous Catheter Indications: Drug and/or fluid administration and Frequent blood sampling  Procedure Details Consent: Risks of procedure as well as the alternatives and risks of each were explained to the (patient/caregiver).  Consent for procedure obtained. Time Out: Verified patient identification, verified procedure, site/side was marked, verified correct patient position, special equipment/implants available, medications/allergies/relevent history reviewed, required imaging and test results available.  Performed  Maximum sterile technique was used including antiseptics, cap, gloves, gown, hand hygiene, mask and sheet. Skin prep: Chlorhexidine; local anesthetic administered A antimicrobial bonded/coated triple lumen catheter was placed in the left internal jugular vein using the Seldinger technique.  Evaluation Blood flow good Complications: No apparent complications Patient did tolerate procedure well. Chest X-ray ordered to verify placement.  CXR: pending.  Rachael Rivas 08/16/2014, 11:08 PM

## 2014-07-27 NOTE — ED Provider Notes (Signed)
CSN: [id number]     Arrival date & time 08/16/2014  1834 History   First MD Initiated Contact with Patient 07/28/2014 1843     Chief Complaint  Patient presents with  . Code Stroke     (Consider location/radiation/quality/duration/timing/severity/associated sxs/prior Treatment) HPI Comments: Patient is a 73 year old female with no significant past medical history. She was brought by her husband for evaluation of mental status change. She was at church this evening and developed the sudden onset of headache behind her right eye. She became less responsive and confused and vomited en route. She complains of headache, but is unable to offer much more history due to severity of illness. According to her husband, she was in her normal state of health until 6:00 this evening.  The history is provided by the patient.    Past Medical History  Diagnosis Date  . Cataract of right eye    Past Surgical History  Procedure Laterality Date  . Breast surgery Left     cyst  . Abdominal hysterectomy    . Cataract extraction w/phaco Right 06/20/2013    Procedure: CATARACT EXTRACTION PHACO AND INTRAOCULAR LENS PLACEMENT (IOC);  Surgeon: Gemma PayorKerry Hunt, MD;  Location: [facility];  Service: Ophthalmology;  Laterality: Right;  CDE: 16.48  . Cataract extraction w/phaco Left 05/26/2014    Procedure: CATARACT EXTRACTION PHACO AND INTRAOCULAR LENS PLACEMENT (IOC);  Surgeon: Gemma PayorKerry Hunt, MD;  Location: [facility];  Service: Ophthalmology;  Laterality: Left;  CDE 14.09   No family history on file. History  Substance Use Topics  . Smoking status: Never Smoker   . Smokeless tobacco: Not on file  . Alcohol Use: No   OB History   Grav Para Term Preterm Abortions TAB SAB Ect Mult Living                 Review of Systems  Unable to perform ROS     Allergies  Review of patient's allergies indicates no known allergies.  Home Medications   Prior to Admission medications   Not on File   BP 200/110  Pulse 90  Temp(Src)  98.2 F (36.8 C) (Oral)  Resp 20  Ht 5\' 7"  (1.702 m)  Wt 120 lb (54.432 kg)  BMI 18.79 kg/m2  SpO2 98% Physical Exam  Nursing note and vitals reviewed. Constitutional: She is oriented to person, place, and time. She appears well-developed and well-nourished.  Patient is a 73 year old female who appears confused and is slow to respond. She is awake and alert.  HENT:  Head: Normocephalic and atraumatic.  Eyes: EOM are normal. Pupils are equal, round, and reactive to light.  Neck: Normal range of motion. Neck supple.  Cardiovascular: Normal rate and regular rhythm.   Pulmonary/Chest: Effort normal and breath sounds normal.  Abdominal: Soft. Bowel sounds are normal.  Musculoskeletal: Normal range of motion. She exhibits no edema.  Neurological: She is alert and oriented to person, place, and time. No cranial nerve deficit. She exhibits normal muscle tone. Coordination abnormal.  Strength is 5 out of 5 in the bilateral upper and lower extremities. She is slow to respond to commands using her left side.    ED Course  Procedures (including critical care time) Labs Review Labs Reviewed  CBG MONITORING, ED - Abnormal; Notable for the following:    Glucose-Capillary 151 (*)    All other components within normal limits  CBC WITH DIFFERENTIAL  COMPREHENSIVE METABOLIC PANEL  PROTIME-INR  APTT  I-STAT CHEM 8, ED  I-STAT TROPOININ,  ED    Imaging Review No results found.   Date: 2014-08-17  Rate: 90's  Rhythm: normal sinus rhythm  QRS Axis: normal  Intervals: normal  ST/T Wave abnormalities: normal  Conduction Disutrbances:none  Narrative Interpretation:   Old EKG Reviewed: none available    MDM   Final diagnoses:  None    Patient arrived here confused but awake and alert. She was slow to respond to commands with her left arm and leg. For this reason, a code stroke was called and the patient was taken immediately to the CT scanner. Unfortunately this revealed a large  right-sided occipitoparietal hemorrhage. She was then started on a Cardene drip.  I discussed these results with Dr. Lovell Sheehan from neurosurgery who did not feel as though this was a situation he could intervene on. He recommended admission to the neuro ICU. I've spoken with Dr. Roseanne Reno from neurology and Dr. [name] from critical care and the patient will be transferred to [facility] and admitted to the ICU.  Her mental status continued to decline and she developed a blown pupil on the right. I made the decision to perform rapid sequence induction for intubation to secure her airway for transport. She was given etomidate and succinylcholine and the cords were easily visualized and a 7.5 endotracheal tube was placed. Tube placement was confirmed with direct visualization, auscultation over the chest and abdomen, an end-tidal CO2. She was started on propofol drip for sedation and will be transported by care length to [facility].  CRITICAL CARE Performed by: Geoffery Lyons Total critical care time: 60 minutes Critical care time was exclusive of separately billable procedures and treating other patients. Critical care was necessary to treat or prevent imminent or life-threatening deterioration. Critical care was time spent personally by me on the following activities: development of treatment plan with patient and/or surrogate as well as nursing, discussions with consultants, evaluation of patient's response to treatment, examination of patient, obtaining history from patient or surrogate, ordering and performing treatments and interventions, ordering and review of laboratory studies, ordering and review of radiographic studies, pulse oximetry and re-evaluation of patient's condition.     Geoffery Lyons, MD August 17, 2014 [phone]

## 2014-07-27 NOTE — H&P (Signed)
PULMONARY / CRITICAL CARE MEDICINE   Name: Rachael Rivas MRN: [id number] DOB: 1941-01-26    ADMISSION DATE:  08/18/2014  CHIEF COMPLAINT:  Headache  INITIAL PRESENTATION:   STUDIES:  CT head 8/9  SIGNIFICANT EVENTS: Intubated 8/9 CVL insertion 8/9, 3% started   HISTORY OF PRESENT ILLNESS:  Patient unable to give history due to altered mental status. History taken from chart review. She is a 73 year old female with no significant past medical history. According to her husband, she was in her normal state of health until 6PM today. She was brought by her husband for evaluation of mental status change. She was at church this evening and developed the sudden onset of headache behind her right eye. She became less responsive and confused and vomited en route. Blood pressure was markedly elevated at 200/110. 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 Surrounding edema and mass effect were noted with a 10 mm right to left midline shift and early signs of trans-tentorial uncal herniation. Patient showed signs of rapid deterioration including increase in pupillary size of the right and reduced responsiveness as well as increased weakness of left extremities and she was intubated for airway protection. Nicardipine drip started in ED.  Non smoker, no EtOH, no drug abuse. Does not take any medications.   PAST MEDICAL HISTORY :  Past Medical History  Diagnosis Date  . Cataract of right eye    Past Surgical History  Procedure Laterality Date  . Breast surgery Left     cyst  . Abdominal hysterectomy    . Cataract extraction w/phaco Right 06/20/2013    Procedure: CATARACT EXTRACTION PHACO AND INTRAOCULAR LENS PLACEMENT (IOC);  Surgeon: Gemma PayorKerry Hunt, MD;  Location: [facility];  Service: Ophthalmology;  Laterality: Right;  CDE: 16.48  . Cataract extraction w/phaco  Left 05/26/2014    Procedure: CATARACT EXTRACTION PHACO AND INTRAOCULAR LENS PLACEMENT (IOC);  Surgeon: Gemma PayorKerry Hunt, MD;  Location: [facility];  Service: Ophthalmology;  Laterality: Left;  CDE 14.09   Prior to Admission medications   Not on File   No Known Allergies  FAMILY HISTORY:  No family history on file. SOCIAL HISTORY:  reports that she has never smoked. She does not have any smokeless tobacco history on file. She reports that she does not drink alcohol or use illicit drugs.  REVIEW OF SYSTEMS:  Unable to obtain due to altered mental status  SUBJECTIVE:   VITAL SIGNS: Temp:  [98.2 F (36.8 C)] 98.2 F (36.8 C) (08/09 1838) Pulse Rate:  [81-102] 102 (08/09 2000) Resp:  [12-24] 12 (08/09 2000) BP: (143-200)/(76-110) 164/83 mmHg (08/09 2000) SpO2:  [95 %-100 %] 99 % (08/09 2000) FiO2 (%):  [40 %-50 %] 50 % (08/09 2057) Weight:  [54.432 kg (120 lb)] 54.432 kg (120 lb) (08/09 1838) HEMODYNAMICS:   VENTILATOR SETTINGS: Vent Mode:  [-] PRVC FiO2 (%):  [40 %-50 %] 50 % Set Rate:  [14 bmp-24 bmp] 24 bmp Vt Set:  [460 mL-500 mL] 460 mL PEEP:  [5 cmH20] 5 cmH20 Plateau Pressure:  [15 cmH20] 15 cmH20 INTAKE / OUTPUT: No intake or output data in the 24 hours ending September 23, 2014 2128  PHYSICAL EXAMINATION: General:  Intubated, sedated, does not follow commands Neuro:  Has mild decerebrate posturing R>L, able to localize pain on the left, withdraws to pain on the right, no facial droop HEENT:  Atraumatic, pupils equal and sluggish,  ET tube in place Cardiovascular:  RRR, no loud murmur Lungs:  No wheeze, has intermittent rhonchi Abdomen:  Soft, nontender, no guarding Musculoskeletal:  No gross deformities, no edema Skin:  No ecchymosis, no sacral ulcer  LABS:  CBC  Recent Labs Lab 07/19/2014 1850 07/24/2014 2115  WBC 9.7 24.4*  HGB 13.7 14.0  HCT 40.7 41.3  PLT 208 192   Coag's  Recent Labs Lab 08/15/2014 1850  APTT 24  INR 1.06   BMET  Recent Labs Lab 07/19/2014 1850   NA 139  K 3.7  CL 102  CO2 23  BUN 18  CREATININE 0.90  GLUCOSE 159*   Electrolytes  Recent Labs Lab 07/22/2014 1850  CALCIUM 9.2   Sepsis Markers No results found for this basename: LATICACIDVEN, PROCALCITON, O2SATVEN,  in the last 168 hours ABG No results found for this basename: PHART, PCO2ART, PO2ART,  in the last 168 hours Liver Enzymes  Recent Labs Lab 08/16/2014 1850  AST 27  ALT 15  ALKPHOS 119*  BILITOT 0.4  ALBUMIN 4.2   Cardiac Enzymes No results found for this basename: TROPONINI, PROBNP,  in the last 168 hours Glucose  Recent Labs Lab 07/25/2014 1846  GLUCAP 151*    Imaging No results found. CT head: 1. 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 There is a large amount of surrounding edema and associated mass effect, with extensive right to left midline shift and evidence suggesting early transtentorial uncal herniation, as above   ASSESSMENT / PLAN:  PULMONARY OETT 8/9 A: VDRF due to altered mental status due to ICH P:   Vent support and wean as tolerated, in the acute setting goal PCO2 between 35-40 to decrease ICP in the first 24 hours VAP bundle Adequate sedation and pain control  CARDIOVASCULAR CVL 8/9 LIJ A: HTN P: Controlled with nicardipine gtt  RENAL A:  No acute issues P:   Avoid nephrotoxic meds  GASTROINTESTINAL A:  No acute issue P:   Start tube feed when able  HEMATOLOGIC A:  No acute issue P:  Monitor hb  INFECTIOUS A:  No acute issue P:   BCx2  UC  Sputum Abx:   ENDOCRINE A:  No acute issue P:   Monitor blood sugar, goal 140-180  NEUROLOGIC A:  Large R ICH likely hypertensive bleed, differential include: ICH due to neoplasm, aneurysm, etc P:   BP control with cardene gtt, goal 140 3% saline with goal Na 145-150, BMP Q5 Neurology and NeuroSx consult Repeat CT  in AM MRI/MRA head, Echo Check Lipid, HbA1c Adequate sedation, RASS goal: -2 When pt more awake, PT/OT/speech consult Pt is currently full code  TODAY'S SUMMARY: Large R ICH, hypertensive. Nicardipine gtt and 3% saline started, left IJ inserted. Intubated. Neurology and Neurosurgery consult pending.  I have personally obtained a history, examined the patient, evaluated laboratory and imaging results, formulated the assessment and plan and placed orders. CRITICAL CARE: The patient is critically ill with multiple organ systems failure and requires high complexity decision making for assessment and support, frequent evaluation and titration of therapies, application of advanced monitoring technologies and extensive interpretation of multiple databases. Critical Care Time devoted to patient care services described in this note is 45 minutes.    Pulmonary and Critical Care Medicine [facility] Pager: [phone]  08/10/2014, 9:28 PM

## 2014-07-27 NOTE — Progress Notes (Signed)
eLink Physician-Brief Progress Note Patient Name: Gladstone PihBettie E Wollin DOB: [birth year] MRN: [id number]  Date of Service  07/23/2014   HPI/Events of Note  Hypokalemia and hypophosphatemia   eICU Interventions  Potassium and phos replaced   Intervention Category Intermediate Interventions: Electrolyte abnormality - evaluation and management  Toiya Morrish 08/09/2014, 11:59 PM

## 2014-07-27 NOTE — Progress Notes (Signed)
Patient ID: Rachael Rivas, female   DOB: 09-23-1941, 73 y.o.   MRN: [id number] 73 year old female in usual state of good health until earlier today when she developed unusual mental status. [facility] commands and CT head showed large right parieto-occipital hemorrhage stroke with significant midline shift. Neurosurgery (Dr Lovell SheehanJenkins) was consulted at that time. He reviewed the films, and felt at that time with that exam that surgery was not indicated. Shortly thereafter...about 7 pm, the patient got worse and showed signs of herniation with worsening neuro status, pupillary assymetry, and mixed posturing.   At this time her eyes are closed, pupils show the right to be slihgtly larger. She has some mixed localization and posturing, but follows no commands.  [facility] by [facility]. Neurology was consulted. Family wanted to know what surgery might have to offer, and I had a long conversation with them. I tried as accurately to draw the most likely outcomes including visual field cuts, movement on the left side, coma, and death. After hearing the risks/ benefits, and relative odds of a functional outcome, they decided to not have her undergo ant surgery, and try conservative measures at this time. i think that is a reasonable choice and informed them of such.  Will defer treatment to CCM.

## 2014-07-27 NOTE — ED Notes (Signed)
Charting from 1840 to present time: When patient arrived to room 1, alert, but poor attention/concentration. Stood and transferred to stretcher with assistance, noticeably dragging left leg behind. Pupils PERRL, but sluggish at 2mm. Grips on left side moderate, right side strong. Does follow commands with delay and repeated instruction. MD called to bedside. Patient nauseated, dry heaves. Patient rubbing right forehead and top of head on the right side. Patient to CT with RN. Continues to dry heave in CT, Zofran order obtained and given to patient in CT by Earl Galasborne, Charity fundraiserN. Patient returned to room. 2nd IV initiated. Patient becoming more agitated in room. Pupils reassessed pupils at 1920, right pupil 6 mm non-reactive, left pupil remains 2 mm. Decorticate posturing noted at that 1920 as well. MD made aware. Preparations for intubation started at that time. Husband informed and updated of patient condition.

## 2014-07-27 NOTE — Progress Notes (Signed)
eLink Physician-Brief Progress Note Patient Name: Rachael Rivas DOB: 09-28-1941 MRN: [id number]  Date of Service  07/29/2014   HPI/Events of Note   [facility]. [facility].  eICU Interventions   Basic orders placed. Floor MD to eval and formally admit.    Intervention Category Major Interventions: OtherCurt Bears:  [name] R. 08/08/2014, 9:01 PM

## 2014-07-27 NOTE — Consult Note (Addendum)
Referring Physician: Dr. Tyson AliasFeinstein    Chief Complaint: Acute hemorrhagic stroke.  HPI: Rachael Rivas is an 73 y.o. female with no known medical history who [facility] acute onset headache and nausea and vomiting as well as left side weakness at 6 PM today. CT scan of the head showed large acute hemorrhagic infarction centered in the right parietal and occipital regions with associated subdural extension along the tentorium cerebelli and falx cerebri. Surrounding edema and mass effect were noted with a 10 mm right to left midline shift and early signs of trans-tentorial uncal herniation. Patient showed signs of rapid deterioration including increase in pupillary size of the right and reduced responsiveness as well as increased weakness of left extremities. Blood pressure was markedly elevated at 200/110. She was intubated and placed on mechanical ventilation as well as started on Cardene drip [facility]. Neurosurgery was consulted and recommended no surgical intervention.  LSN: 6 PM on 31-Oct-2014 tPA Given: No: Acute ICH MRankin: 4  Past Medical History  Diagnosis Date  . Cataract of right eye     No family history on file.   Medications: Patient was on no medications prior to admission.  ROS: History obtained from spouse  General ROS: negative for - chills, fatigue, fever, night sweats, weight gain or weight loss Psychological ROS: negative for - behavioral disorder, hallucinations, memory difficulties, mood swings or suicidal ideation Ophthalmic ROS: negative for - blurry vision, double vision, eye pain or loss of vision ENT ROS: negative for - epistaxis, nasal discharge, oral lesions, sore throat, tinnitus or vertigo Allergy and Immunology ROS: negative for - hives or itchy/watery eyes Hematological and Lymphatic ROS: negative for - bleeding problems, bruising or swollen lymph nodes Endocrine ROS: negative for - galactorrhea, hair  pattern changes, polydipsia/polyuria or temperature intolerance Respiratory ROS: negative for - cough, hemoptysis, shortness of breath or wheezing Cardiovascular ROS: negative for - chest pain, dyspnea on exertion, edema or irregular heartbeat Gastrointestinal ROS: negative for - abdominal pain, diarrhea, hematemesis, nausea/vomiting or stool incontinence Genito-Urinary ROS: negative for - dysuria, hematuria, incontinence or urinary frequency/urgency Musculoskeletal ROS: negative for - joint swelling or muscular weakness Neurological ROS: as noted in HPI Dermatological ROS: negative for rash and skin lesion changes  Physical Examination: Blood pressure 164/83, pulse 102, temperature 98.2 F (36.8 C), temperature source Oral, resp. rate 12, height 5\' 7"  (1.702 m), weight 54.432 kg (120 lb), SpO2 99.00%.  Neurologic Examination: Patient is intubated and on mechanical ventilation. She is spontaneous respirations as well. She was responsive to noxious stimuli on the right, and to a much lesser extent, noxious stimulation on the left. Right pupil was slightly larger than left and both reacted normally to light. Extraocular movements were intact with oculocephalic maneuvers. No facial asymmetry was noted. Patient had minimal spontaneous movements left extremities as well as normal reaction to noxious stimuli. Strength of right extremities was normal proximally and distally. Muscle tone was flaccid throughout. Patient had occasional decerebrate posturing on the right, and to a lesser extent on the left. Deep tendon reflexes were 2+ and brisk and symmetrical. Plantar responses were extensor bilaterally.  Ct Head Wo Contrast  07/30/2014   CLINICAL DATA:  Code stroke.  Left-sided weakness.  Confusion.  EXAM: CT HEAD WITHOUT CONTRAST  TECHNIQUE: Contiguous axial images were obtained from the base of the skull through the vertex without intravenous contrast.  COMPARISON:  No priors.  FINDINGS: Large  intraparenchymal hemorrhage (measuring  up to 6.8 x 4.7 cm) occupying much of the right parietal region and right occipital region, with surrounding low-attenuation (adjacent edema). There is also some high attenuation material layering along the right falx cerebri and tentorium cerebelli measuring up to 13 mm in thickness, presumably some subpleural component of this hemorrhage. This hemorrhage and the surrounding edema is exerting mass effect as evidenced by 11 mm of right to left midline shift. This is compressing the temporal and posterior aspect of the right lateral ventricle, and there is some effacement of the basal cisterns (particularly the suprasellar cistern) on the right side, concerning for early transtentorial uncal herniation. No definite intraventricular extension of hemorrhage. No associated hydrocephalus at this time. All paranasal sinuses and mastoids are well pneumatized. No acute displaced skull fractures are noted.  IMPRESSION: 1. 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 There is a large amount of surrounding edema and associated mass effect, with extensive right to left midline shift and evidence suggesting early transtentorial uncal herniation, as above. Critical Value/emergent results were called by telephone at the time of interpretation on 07/23/2014 at 7:07 pm to Dr. Geoffery Lyons, who verbally acknowledged these results.   Electronically Signed   By: Trudie Reed M.D.   On: 08/01/2014 19:10   Dg Chest Port 1 View  07/26/2014   CLINICAL DATA:  Respiratory failure.  Intracranial hemorrhage  EXAM: PORTABLE CHEST - 1 VIEW  COMPARISON:  No priors.  FINDINGS: Patient is intubated, with the tip of the endotracheal tube approximately 2 cm above the carina. A nasogastric tube is seen extending into the stomach, however, the tip of the nasogastric tube  extends below the lower margin of the image. Lung volumes are low. No consolidative airspace disease. No pleural effusions. No pneumothorax. No pulmonary nodule or mass noted. Pulmonary vasculature and the cardiomediastinal silhouette are within normal limits. Atherosclerosis in the thoracic aorta.  IMPRESSION: 1. Support apparatus, as above. Endotracheal tube could be withdrawn 2 cm for more optimal placement. 2. Low lung volumes without radiographic evidence of acute cardiopulmonary disease. 3. Atherosclerosis.   Electronically Signed   By: Trudie Reed M.D.   On: 08/15/2014 21:54    Assessment: 73 y.o. female with no documented medical disorder presenting with acute large right parietal occipital parenchymal infarction with subdural extension as well as edema and mass effect with midline shift, in addition to presenting with hypertensive urgency.   Stroke Risk Factors - hypertension  Plan: 1. Management of cerebral edema with 3% normal saline IV 2. MRI, MRA  of the brain without contrast, when feasible 3. PT consult, OT consult, Speech consult, when feasible 4. Echocardiogram 5. Carotid dopplers 6. Prophylactic therapy-None 7. Hemoglobin A1c and fasting lipid panel 8. Repeat CT without contrast  Patient's acute neurological intervention required complex decision-making and management including management of cerebral edema and hypertensive urgency, as well as time spent with family counseling. Total critical care time was 60 minutes.  C.R. Roseanne Reno, MD [facility] [phone]  08/13/2014, 10:02 PM

## 2014-07-27 NOTE — ED Notes (Signed)
Pt reports to ED after experiencing "right eye pain" after sudden onset at church. Pt presented to triage with generalized weakness with slowed mentation from baseline. Pt began to experience increased left sided weakness from triage to rooming. Husband reported that wife was "normal" until the "middle of church." Husband reports that this was "around 6pm." Dr Judd Lienelo did brief neuro and sent pt to CT scan.

## 2014-07-27 NOTE — ED Notes (Signed)
Report given to [place], RN Neuro ICU at [facility]

## 2014-07-28 ENCOUNTER — Inpatient Hospital Stay (HOSPITAL_COMMUNITY): Payer: Medicare Other

## 2014-07-28 DIAGNOSIS — I619 Nontraumatic intracerebral hemorrhage, unspecified: Secondary | ICD-10-CM

## 2014-07-28 DIAGNOSIS — I359 Nonrheumatic aortic valve disorder, unspecified: Secondary | ICD-10-CM

## 2014-07-28 LAB — BLOOD GAS, ARTERIAL
Acid-base deficit: 2.4 mmol/L — ABNORMAL HIGH (ref 0.0–2.0)
Bicarbonate: 21.1 mEq/L (ref 20.0–24.0)
DRAWN BY: 41877
FIO2: 0.3 %
O2 Saturation: 97.7 %
PEEP/CPAP: 5 cmH2O
PO2 ART: 93.9 mmHg (ref 80.0–100.0)
Patient temperature: 98.6
RATE: 24 resp/min
TCO2: 22.1 mmol/L (ref 0–100)
VT: 460 mL
pCO2 arterial: 31.7 mmHg — ABNORMAL LOW (ref 35.0–45.0)
pH, Arterial: 7.44 (ref 7.350–7.450)

## 2014-07-28 LAB — BASIC METABOLIC PANEL
Anion gap: 14 (ref 5–15)
Anion gap: 14 (ref 5–15)
Anion gap: 14 (ref 5–15)
Anion gap: 12 (ref 5–15)
BUN: 15 mg/dL (ref 6–23)
BUN: 13 mg/dL (ref 6–23)
BUN: 13 mg/dL (ref 6–23)
BUN: 11 mg/dL (ref 6–23)
CALCIUM: 8.6 mg/dL (ref 8.4–10.5)
CHLORIDE: 105 meq/L (ref 96–112)
CHLORIDE: 110 meq/L (ref 96–112)
CO2: 23 meq/L (ref 19–32)
CO2: 21 meq/L (ref 19–32)
CO2: 22 meq/L (ref 19–32)
CO2: 22 meq/L (ref 19–32)
CREATININE: 0.7 mg/dL (ref 0.50–1.10)
CREATININE: 0.68 mg/dL (ref 0.50–1.10)
CREATININE: 0.69 mg/dL (ref 0.50–1.10)
Calcium: 8.6 mg/dL (ref 8.4–10.5)
Calcium: 8.5 mg/dL (ref 8.4–10.5)
Calcium: 8.3 mg/dL — ABNORMAL LOW (ref 8.4–10.5)
Chloride: 110 mEq/L (ref 96–112)
Chloride: 114 mEq/L — ABNORMAL HIGH (ref 96–112)
Creatinine, Ser: 0.66 mg/dL (ref 0.50–1.10)
GFR calc Af Amer: >90 mL/min (ref >90)
GFR calc Af Amer: >90 mL/min (ref >90)
GFR calc Af Amer: >90 mL/min (ref >90)
GFR calc non Af Amer: 83 mL/min — ABNORMAL LOW (ref >90)
GFR calc non Af Amer: 84 mL/min — ABNORMAL LOW (ref >90)
GFR calc non Af Amer: 84 mL/min — ABNORMAL LOW (ref >90)
GFR, EST NON AFRICAN AMERICAN: 85 mL/min — AB (ref >90)
GLUCOSE: 160 mg/dL — AB (ref 70–99)
GLUCOSE: 132 mg/dL — AB (ref 70–99)
GLUCOSE: 147 mg/dL — AB (ref 70–99)
Glucose, Bld: 144 mg/dL — ABNORMAL HIGH (ref 70–99)
POTASSIUM: 4 meq/L (ref 3.7–5.3)
POTASSIUM: 4.1 meq/L (ref 3.7–5.3)
POTASSIUM: 3.6 meq/L — AB (ref 3.7–5.3)
Potassium: 4 mEq/L (ref 3.7–5.3)
Sodium: 142 mEq/L (ref 137–147)
Sodium: 145 mEq/L (ref 137–147)
Sodium: 146 mEq/L (ref 137–147)
Sodium: 148 mEq/L — ABNORMAL HIGH (ref 137–147)

## 2014-07-28 LAB — CBC
HEMATOCRIT: 36.9 % (ref 36.0–46.0)
HEMOGLOBIN: 12.1 g/dL (ref 12.0–15.0)
MCH: 29.8 pg (ref 26.0–34.0)
MCHC: 32.8 g/dL (ref 30.0–36.0)
MCV: 90.9 fL (ref 78.0–100.0)
Platelets: 163 10*3/uL (ref 150–400)
RBC: 4.06 MIL/uL (ref 3.87–5.11)
RDW: 12.8 % (ref 11.5–15.5)
WBC: 14.8 10*3/uL — ABNORMAL HIGH (ref 4.0–10.5)

## 2014-07-28 LAB — SODIUM
SODIUM: 151 meq/L — AB (ref 137–147)
Sodium: 145 mEq/L (ref 137–147)
Sodium: 152 mEq/L — ABNORMAL HIGH (ref 137–147)

## 2014-07-28 LAB — I-STAT CHEM 8, ED
BUN: 18 mg/dL (ref 6–23)
CHLORIDE: 106 meq/L (ref 96–112)
Calcium, Ion: 1.14 mmol/L (ref 1.13–1.30)
Creatinine, Ser: 0.9 mg/dL (ref 0.50–1.10)
Glucose, Bld: 159 mg/dL — ABNORMAL HIGH (ref 70–99)
HEMATOCRIT: 42 % (ref 36.0–46.0)
Hemoglobin: 14.3 g/dL (ref 12.0–15.0)
Potassium: 3.5 mEq/L — ABNORMAL LOW (ref 3.7–5.3)
Sodium: 139 mEq/L (ref 137–147)
TCO2: 21 mmol/L (ref 0–100)

## 2014-07-28 LAB — GLUCOSE, CAPILLARY
GLUCOSE-CAPILLARY: 119 mg/dL — AB (ref 70–99)
GLUCOSE-CAPILLARY: 133 mg/dL — AB (ref 70–99)
GLUCOSE-CAPILLARY: 125 mg/dL — AB (ref 70–99)
GLUCOSE-CAPILLARY: 133 mg/dL — AB (ref 70–99)
Glucose-Capillary: 136 mg/dL — ABNORMAL HIGH (ref 70–99)
Glucose-Capillary: 144 mg/dL — ABNORMAL HIGH (ref 70–99)
Glucose-Capillary: 152 mg/dL — ABNORMAL HIGH (ref 70–99)

## 2014-07-28 LAB — MAGNESIUM: Magnesium: 2 mg/dL (ref 1.5–2.5)

## 2014-07-28 LAB — HEMOGLOBIN A1C
HEMOGLOBIN A1C: 5.9 % — AB (ref <5.7)
Mean Plasma Glucose: 123 mg/dL — ABNORMAL HIGH (ref <117)

## 2014-07-28 LAB — TROPONIN I
Troponin I: <0.3 ng/mL (ref <0.30)
Troponin I: <0.3 ng/mL (ref <0.30)

## 2014-07-28 LAB — PHOSPHORUS: PHOSPHORUS: 2.6 mg/dL (ref 2.3–4.6)

## 2014-07-28 MED ORDER — PNEUMOCOCCAL VAC POLYVALENT 25 MCG/0.5ML IJ INJ
0.5000 mL | INJECTION | INTRAMUSCULAR | Status: AC
  Administered 2014-07-29: 0.5 mL via INTRAMUSCULAR
  Filled 2014-07-28: qty 0.5

## 2014-07-28 MED ORDER — ACETAMINOPHEN 325 MG PO TABS
650.0000 mg | ORAL_TABLET | Freq: Four times a day (QID) | ORAL | Status: DC | PRN
  Administered 2014-07-28 – 2014-07-31 (×6): 650 mg via ORAL
  Filled 2014-07-28 (×5): qty 2

## 2014-07-28 NOTE — Progress Notes (Addendum)
VASCULAR LAB PRELIMINARY  PRELIMINARY  PRELIMINARY  PRELIMINARY  Carotid duplex  completed.    Preliminary report:  Bilateral:  1-39% ICA stenosis.  Vertebral artery flow is antegrade.   Shadee Rathod, RVT 07/28/2014, 1:32 PM

## 2014-07-28 NOTE — Progress Notes (Signed)
Three gold colored rings removed from patient's left ring finger. One dome shaped gold colored ring, one gold colored band and one gold colored ring with clear stone given to patient's husband by RN.

## 2014-07-28 NOTE — Progress Notes (Signed)
  Echocardiogram 2D Echocardiogram has been performed.  Rachael JunglingCooper, Emin Foree M 07/28/2014, 11:14 AM

## 2014-07-28 NOTE — Progress Notes (Signed)
Utilization Review Completed.Rachael Rivas T8/09/2014  

## 2014-07-28 NOTE — Progress Notes (Signed)
Stroke Team Progress Note  HISTORY Rachael Rivas is an 73 y.o. female with no known medical history who was taken to the emergency room at [facility] following acute onset headache and nausea and vomiting as well as left side weakness at 6 PM today. CT scan of the head showed large acute hemorrhagic infarction centered in the right parietal and occipital regions with associated subdural extension along the tentorium cerebelli and falx cerebri. Surrounding edema and mass effect were noted with a 10 mm right to left midline shift and early signs of trans-tentorial uncal herniation. Patient showed signs of rapid deterioration including increase in pupillary size of the right and reduced responsiveness as well as increased weakness of left extremities. Blood pressure was markedly elevated at 200/110. She was intubated and placed on mechanical ventilation as well as started on Cardene drip prior to transfer to [facility]. Neurosurgery was consulted and recommended no surgical intervention.  LSN: 6 PM on 2014-07-28  tPA Given: No: Acute ICH     She was admitted to the neuro ICU for further evaluation and treatment.  SUBJECTIVE Her husband and brother in law are at the bedside.  Overall she feels her condition is gradually worsening with repeat CT head today showing increasing cerebral edema with 17 mm right-to-left midline shift.  OBJECTIVE Most recent Vital Signs: Filed Vitals:   07/28/14 0900 07/28/14 0915 07/28/14 0930 07/28/14 0945  BP: 127/60 123/58 115/62 130/65  Pulse: 85 84 82 86  Temp: 100 F (37.8 C) 100 F (37.8 C) 100.2 F (37.9 C) 100.2 F (37.9 C)  TempSrc:      Resp: 24 24 24 24   Height:      Weight:      SpO2: 100% 100% 100% 100%   CBG (last 3)   Recent Labs  07/28/14 0035 07/28/14 0459 07/28/14 0736  GLUCAP 152* 119* 144*    IV Fluid Intake:   . niCARDipine Stopped (07/28/14 0830)  . propofol 25 mcg/kg/min (07/28/14 0830)  . sodium chloride (hypertonic) 100 mL/hr (07/28/14  0517)    MEDICATIONS  . antiseptic oral rinse  7 mL Mouth Rinse QID  . chlorhexidine  15 mL Mouth Rinse BID  . insulin aspart  2-6 Units Subcutaneous 6 times per day  . niCARDipine  5 mg/hr Intravenous Once  . pantoprazole (PROTONIX) IV  40 mg Intravenous QHS  . [START ON 07/29/2014] pneumococcal 23 valent vaccine  0.5 mL Intramuscular Tomorrow-1000   PRN:  sodium chloride, sodium chloride, fentaNYL  Diet:  NPO   Activity:  Bedrest    DVT Prophylaxis: SCDs  CLINICALLY SIGNIFICANT STUDIES Basic Metabolic Panel:  Recent Labs Lab 2014-07-28 2115  07/28/14 0359 07/28/14 0553 07/28/14 0845  NA 140  < > 145 146 148*  K 3.5*  < > 4.0 4.1 3.6*  CL 101  < > 110 110 114*  CO2 22  < > 21 22 22   GLUCOSE 210*  < > 132* 147* 144*  BUN 16  < > 13 13 11   CREATININE 0.78  < > 0.66 0.68 0.69  CALCIUM 8.9  < > 8.6 8.5 8.3*  MG 2.0  --  2.0  --   --   PHOS 1.9*  --  2.6  --   --   < > = values in this interval not displayed. Liver Function Tests:  Recent Labs Lab 2014/07/28 1850 2014/07/28 2115  AST 27 31  ALT 15 17  ALKPHOS 119* 116  BILITOT 0.4 1.0  PROT  7.6 7.8  ALBUMIN 4.2 4.4   CBC:  Recent Labs Lab 07/31/2014 1850  08/02/2014 2115 07/28/14 0359  WBC 9.7  --  24.4* 14.8*  NEUTROABS 5.4  --  21.0*  --   HGB 13.7  < > 14.0 12.1  HCT 40.7  < > 41.3 36.9  MCV 91.1  --  91.0 90.9  PLT 208  --  192 163  < > = values in this interval not displayed. Coagulation:  Recent Labs Lab 07/26/2014 1850  LABPROT 13.8  INR 1.06   Cardiac Enzymes:  Recent Labs Lab 07/19/2014 2115 07/28/14 0200 07/28/14 0845  TROPONINI <0.30 <0.30 <0.30   Urinalysis: No results found for this basename: COLORURINE, APPERANCEUR, LABSPEC, PHURINE, GLUCOSEU, HGBUR, BILIRUBINUR, KETONESUR, PROTEINUR, UROBILINOGEN, NITRITE, LEUKOCYTESUR,  in the last 168 hours Lipid Panel    Component Value Date/Time   CHOL 215* 08/18/2014 2115   TRIG 147 08/07/2014 2115   TRIG 150* 08/12/2014 2115   HDL 50 08/11/2014 2115    CHOLHDL 4.3 08/02/2014 2115   VLDL 30 07/28/2014 2115   LDLCALC 135* 07/29/2014 2115   HgbA1C  No results found for this basename: HGBA1C    Urine Drug Screen:     Component Value Date/Time   LABOPIA NONE DETECTED 07/23/2014 2306   COCAINSCRNUR NONE DETECTED 07/28/2014 2306   LABBENZ POSITIVE* 08/16/2014 2306   AMPHETMU NONE DETECTED 08/16/2014 2306   THCU NONE DETECTED 08/13/2014 2306   LABBARB NONE DETECTED 08/18/2014 2306    Alcohol Level: No results found for this basename: ETH,  in the last 168 hours  Ct Head Wo Contrast  07/28/2014   CLINICAL DATA:  Follow-up intracranial hemorrhage.  EXAM: CT HEAD WITHOUT CONTRAST  TECHNIQUE: Contiguous axial images were obtained from the base of the skull through the vertex without intravenous contrast.  COMPARISON:  CT of the head performed 07/24/2014  FINDINGS: There has been mild interval improvement in the amount of intraparenchymal blood at the right parietal lobe, reflecting interval evolution of the patient's hemorrhagic infarct. Underlying decreased attenuation is noted at the right parietal and occipital lobes. Blood tracking along the right tentorium cerebelli is relatively stable in appearance.  There is mildly increased subdural blood tracking along the right side of the posterior falx cerebri. An enlarging right-sided subdural hematoma is noted overlying the right frontoparietal region, measuring approximately 8 mm. There is worsening leftward midline shift, measuring up to 1.8 cm more superiorly.  There appears to be slightly worsened effacement of the cisterns about the cerebral peduncles, with stable decreased attenuation at the right cerebral peduncle. Mild transtentorial herniation cannot be excluded.  There may be slightly increased distention of the temporal horn of the left lateral ventricle, raising concern for mild hydrocephalus. There is partial effacement of the right lateral ventricle.  There is no evidence of fracture; visualized osseous  structures are unremarkable in appearance. The orbits are within normal limits. The paranasal sinuses and mastoid air cells are well-aerated. No significant soft tissue abnormalities are seen.  IMPRESSION: 1. Increasing right-sided subdural hematoma overlying the right frontoparietal region, measuring approximately 8 mm. Worsening leftward midline shift noted, measuring up to 1.8 cm superiorly, increased from 1.3 cm on the prior study. 2. Slightly increased effacement of the cisterns about the cerebral peduncles. As before, mild transtentorial herniation cannot be excluded. Partial effacement of the right lateral ventricle. There may be slightly increased distention of the temporal horn of the left lateral ventricle, raising concern for mild hydrocephalus. 3. Mildly increased  subdural blood along the right side of the posterior falx cerebri. Blood along the right tentorium cerebelli is relatively stable in appearance. 4. Mild interval improvement in the amount of intraparenchymal blood of the right parietal lobe; underlying infarct is relatively stable.  These results were called by telephone at the time of interpretation on 07/28/2014 at 5:05 am to [facility] on [facility]M, who verbally acknowledged these results.   Electronically Signed   By: Roanna Raider M.D.   On: 07/28/2014 05:11   Ct Head Wo Contrast  Aug 05, 2014   CLINICAL DATA:  Code stroke.  Left-sided weakness.  Confusion.  EXAM: CT HEAD WITHOUT CONTRAST  TECHNIQUE: Contiguous axial images were obtained from the base of the skull through the vertex without intravenous contrast.  COMPARISON:  No priors.  FINDINGS: Large intraparenchymal hemorrhage (measuring up to 6.8 x 4.7 cm) occupying much of the right parietal region and right occipital region, with surrounding low-attenuation (adjacent edema). There is also some high attenuation material layering along the right falx cerebri and tentorium cerebelli measuring up to 13 mm in thickness, presumably some  subpleural component of this hemorrhage. This hemorrhage and the surrounding edema is exerting mass effect as evidenced by 11 mm of right to left midline shift. This is compressing the temporal and posterior aspect of the right lateral ventricle, and there is some effacement of the basal cisterns (particularly the suprasellar cistern) on the right side, concerning for early transtentorial uncal herniation. No definite intraventricular extension of hemorrhage. No associated hydrocephalus at this time. All paranasal sinuses and mastoids are well pneumatized. No acute displaced skull fractures are noted.  IMPRESSION: 1. 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 There is a large amount of surrounding edema and associated mass effect, with extensive right to left midline shift and evidence suggesting early transtentorial uncal herniation, as above. Critical Value/emergent results were called by telephone at the time of interpretation on 05-Aug-2014 at 7:07 pm to Dr. Geoffery Lyons, who verbally acknowledged these results.   Electronically Signed   By: Trudie Reed M.D.   On: 2014/08/05 19:10   Dg Chest Port 1 View  2014-08-05   CLINICAL DATA:  Central line placement.  EXAM: PORTABLE CHEST - 1 VIEW  COMPARISON:  Chest x-ray 08/05/14.  FINDINGS: An endotracheal tube is in place with tip 3.3 cm above the carina. There is a Left-sided internal jugular central venous catheter with tip terminating in the distal superior vena cava. A nasogastric tube is seen extending into the stomach, however, the tip of the nasogastric tube extends below the lower margin of the image. An electronic device projects over the left apex limiting assessment for are post procedural pneumothorax. With this limitation in mind, no definite pneumothorax is noted. No acute consolidative airspace disease. No  pleural effusions. No evidence of pulmonary edema. Heart size is normal. Upper mediastinal contours are within normal limits. Atherosclerosis in the thoracic aorta.  IMPRESSION: 1. Support apparatus, as above. 2. No radiographic evidence of acute cardiopulmonary disease. 3. Atherosclerosis.   Electronically Signed   By: Trudie Reed M.D.   On: 08/05/2014 23:20   Dg Chest Port 1 View  05-Aug-2014   CLINICAL DATA:  Respiratory failure.  Intracranial hemorrhage  EXAM: PORTABLE CHEST - 1 VIEW  COMPARISON:  No priors.  FINDINGS: Patient is intubated, with the tip of the endotracheal tube approximately 2 cm above the  carina. A nasogastric tube is seen extending into the stomach, however, the tip of the nasogastric tube extends below the lower margin of the image. Lung volumes are low. No consolidative airspace disease. No pleural effusions. No pneumothorax. No pulmonary nodule or mass noted. Pulmonary vasculature and the cardiomediastinal silhouette are within normal limits. Atherosclerosis in the thoracic aorta.  IMPRESSION: 1. Support apparatus, as above. Endotracheal tube could be withdrawn 2 cm for more optimal placement. 2. Low lung volumes without radiographic evidence of acute cardiopulmonary disease. 3. Atherosclerosis.   Electronically Signed   By: Trudie Reed M.D.   On: August 13, 2014 21:54    MRI of the brain  ordered  MRA of the brain  ordered  Carotid Doppler ordered  2D Echocardiogram  ordered  CXR  No radiographic evidence of acute cardiopulmonary disease.      EKG    Sinus tachycardia Nonspecific ST abnormality  Therapy Recommendations pending Physical Exam  Frail elderly caucasian lady not in distress..Intubated. Afebrile. Head is nontraumatic. Neck is supple without bruit.   Cardiac exam no murmur or gallop. Lungs are clear to auscultation. Distal pulses are well felt. Neurological Exam ; sedated and unresponsive. Pupils are 4 mm reactive. Corneal reflexes are present. Dolls eye  movements are sluggish. Fundi were not visualized. No obvious facial weakness. Tongue midline. Moves right side spontaneously and semi-purposefully against gravity. Mild left lower extremity movements. Intermittent posturing of the left upper extremity to painful stimuli. Tone is decreased on the left. Left plantar is upgoing right is downgoing. ASSESSMENT Ms. Rachael Rivas is a 73 y.o. female presenting with sudden onset of mental status changes with nausea vomiting. Imaging confirms a right parieto-occipital large intracerebral hematoma with surrounding cytotoxic edema and 17mm right-to-left midline shift and cerebral herniation. There is also associated tentorial subdural and right convexity subdural hematoma. Etiology of the hemorrhage is indeterminate differential includes hypertensive  (given adm BP 200/110) versus amyloid angiopathy. Hemorrhage infarct or metastasis would be less likely infarct.    On no antiplatelets/anticoagulants prior to admission. And there is no h/o of trauma or fall. Patient with resultant comatose state with left hemiparesis Stroke work up underway.   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 Not on statins prior to admission  Respiratory failure secondary to cerebral edema from large brain hemorrhage   Hospital day # 1  TREATMENT/PLAN  Continue ICU level care with aggressive blood pressure control and keep SBP below 160/90 for 24 hours and then below 180/100.  Keep normothermic, normoglycemic and euvolemic. Continue hypertonic saline for cerebral edema with serum sodium goal 150-155. Postpone MRI scan of the brain till tomorrow. Check echocardiogram and carotid Dopplers DVT and GI prophylaxis. Start tube feeds for nutrition Prognosis is guarded. I had a long 20 minute discussion with the patient's husband and brother-in-law at the bedside regarding her clinical presentation, my evaluation, prognosis, plan  of care and answered questions. He agrees to DO NOT RESUSCITATE but wants to continue present therapy for now. Discuss with Dr.Mungal-PCCM This patient is critically ill and at significant risk of neurological worsening, death and care requires constant monitoring of vital signs, hemodynamics,respiratory and cardiac monitoring,review of multiple databases, neurological assessment, discussion with family, other specialists and medical decision making of high complexity.I have made any additions or clarifications directly to the above note.  I spent 50 minutes of neurocritical care time  in the care of  this patient. Delia Heady, MD  To contact Stroke Continuity provider, please refer to [email or web address] After hours, contact General Neurology

## 2014-07-28 NOTE — Progress Notes (Signed)
PULMONARY / CRITICAL CARE MEDICINE   Name: Rachael Rivas MRN: [id number] DOB: 1941-09-10    ADMISSION DATE:  08/16/2014  CHIEF COMPLAINT:  Headache  INITIAL PRESENTATION:   STUDIES:  CT head 8/9  SIGNIFICANT EVENTS: Intubated 8/9 CVL insertion 8/9, 3% started 8/10 Neuro met with family and may be DNR  HISTORY OF PRESENT ILLNESS:  Patient unable to give history due to altered mental status. History taken from chart review. She is a 73 year old female with no significant past medical history. According to her husband, she was in her normal state of health until 6PM today. She was brought by her husband for evaluation of mental status change. She was at church this evening and developed the sudden onset of headache behind her right eye. She became less responsive and confused and vomited en route. Blood pressure was markedly elevated at 200/110. 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 Surrounding edema and mass effect were noted with a 10 mm right to left midline shift and early signs of trans-tentorial uncal herniation. Patient showed signs of rapid deterioration including increase in pupillary size of the right and reduced responsiveness as well as increased weakness of left extremities and she was intubated for airway protection. Nicardipine drip started in ED.  Non smoker, no EtOH, no drug abuse. Does not take any medications.    SUBJECTIVE:  Sedated on vent, 3% saline infusing VITAL SIGNS: Temp:  [96.3 F (35.7 C)-100.6 F (38.1 C)] 100.6 F (38.1 C) (08/10 1100) Pulse Rate:  [78-102] 88 (08/10 1100) Resp:  [12-28] 24 (08/10 1100) BP: (104-200)/(51-110) 134/71 mmHg (08/10 1100) SpO2:  [95 %-100 %] 100 % (08/10 1100) FiO2 (%):  [30 %-50 %] 30 % (08/10 0859) Weight:  [120 lb (54.432 kg)-143 lb 8.3 oz (65.1 kg)] 143 lb 8.3 oz (65.1 kg)  (08/10 0233) HEMODYNAMICS:   VENTILATOR SETTINGS: Vent Mode:  [-] PRVC FiO2 (%):  [30 %-50 %] 30 % Set Rate:  [14 bmp-24 bmp] 24 bmp Vt Set:  [460 mL-500 mL] 460 mL PEEP:  [5 cmH20] 5 cmH20 Plateau Pressure:  [15 cmH20] 15 cmH20 INTAKE / OUTPUT:  Intake/Output Summary (Last 24 hours) at 07/28/14 1141 Last data filed at 07/28/14 1100  Gross per 24 hour  Intake 2468.66 ml  Output   2180 ml  Net 288.66 ml    PHYSICAL EXAMINATION: General:  Intubated, sedated, does not follow commands Neuro:  Has mild decerebrate posturing R>L, able to localize pain on the left, withdraws to pain on the right, no facial droop HEENT:  Atraumatic, pupils equal and sluggish, ET tube in place Cardiovascular:  RRR, no loud murmur Lungs:  No wheeze, has intermittent rhonchi Abdomen:  Soft, nontender, no guarding, no bs noted Musculoskeletal:  No gross deformities, no edema Skin:  No ecchymosis, warm and dry  LABS:  CBC  Recent Labs Lab 08/05/2014 1850 08/13/2014 1904 08/11/2014 2115 07/28/14 0359  WBC 9.7  --  24.4* 14.8*  HGB 13.7 14.3 14.0 12.1  HCT 40.7 42.0 41.3 36.9  PLT 208  --  192 163   Coag's  Recent Labs Lab 08/12/2014 1850  APTT 24  INR 1.06   BMET  Recent Labs Lab 07/28/14 0359 07/28/14 0553 07/28/14 0845  NA 145 146 148*  K 4.0 4.1 3.6*  CL 110 110 114*  CO2 $Re'21 22 22  'ZlK$ BUN $R'13 13 11  'dX$ CREATININE  0.66 0.68 0.69  GLUCOSE 132* 147* 144*   Electrolytes  Recent Labs Lab 07/25/2014 2115  07/28/14 0359 07/28/14 0553 07/28/14 0845  CALCIUM 8.9  < > 8.6 8.5 8.3*  MG 2.0  --  2.0  --   --   PHOS 1.9*  --  2.6  --   --   < > = values in this interval not displayed. Sepsis Markers  Recent Labs Lab 08/02/2014 2115  LATICACIDVEN 2.5*   ABG  Recent Labs Lab 08/10/2014 2258 07/28/14 0542  PHART 7.450 7.440  PCO2ART 32.9* 31.7*  PO2ART 91.1 93.9   Liver Enzymes  Recent Labs Lab 07/26/2014 1850 07/22/2014 2115  AST 27 31  ALT 15 17  ALKPHOS 119* 116  BILITOT 0.4  1.0  ALBUMIN 4.2 4.4   Cardiac Enzymes  Recent Labs Lab 08/17/2014 2115 07/28/14 0200 07/28/14 0845  TROPONINI <0.30 <0.30 <0.30   Glucose  Recent Labs Lab 08/10/2014 1846 07/28/14 0035 07/28/14 0459 07/28/14 0736  GLUCAP 151* 152* 119* 144*    Imaging Ct Head Wo Contrast  07/29/2014   CLINICAL DATA:  Code stroke.  Left-sided weakness.  Confusion.  EXAM: CT HEAD WITHOUT CONTRAST  TECHNIQUE: Contiguous axial images were obtained from the base of the skull through the vertex without intravenous contrast.  COMPARISON:  No priors.  FINDINGS: Large intraparenchymal hemorrhage (measuring up to 6.8 x 4.7 cm) occupying much of the right parietal region and right occipital region, with surrounding low-attenuation (adjacent edema). There is also some high attenuation material layering along the right falx cerebri and tentorium cerebelli measuring up to 13 mm in thickness, presumably some subpleural component of this hemorrhage. This hemorrhage and the surrounding edema is exerting mass effect as evidenced by 11 mm of right to left midline shift. This is compressing the temporal and posterior aspect of the right lateral ventricle, and there is some effacement of the basal cisterns (particularly the suprasellar cistern) on the right side, concerning for early transtentorial uncal herniation. No definite intraventricular extension of hemorrhage. No associated hydrocephalus at this time. All paranasal sinuses and mastoids are well pneumatized. No acute displaced skull fractures are noted.  IMPRESSION: 1. 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 There is a large amount of surrounding edema and associated mass effect, with extensive right to left midline shift and evidence suggesting early transtentorial uncal herniation, as above. Critical Value/emergent results were  called by telephone at the time of interpretation on 07/20/2014 at 7:07 pm to Dr. Veryl Speak, who verbally acknowledged these results.   Electronically Signed   By: Vinnie Langton M.D.   On: 08/12/2014 19:10   Dg Chest Port 1 View  08/11/2014   CLINICAL DATA:  Central line placement.  EXAM: PORTABLE CHEST - 1 VIEW  COMPARISON:  Chest x-ray 08/12/2014.  FINDINGS: An endotracheal tube is in place with tip 3.3 cm above the carina. There is a Left-sided internal jugular central venous catheter with tip terminating in the distal superior vena cava. A nasogastric tube is seen extending into the stomach, however, the tip of the nasogastric tube extends below the lower margin of the image. An electronic device projects over the left apex limiting assessment for are post procedural pneumothorax. With this limitation in mind, no definite pneumothorax is noted. No acute consolidative airspace disease. No pleural effusions. No evidence of pulmonary edema. Heart size is normal. Upper mediastinal  contours are within normal limits. Atherosclerosis in the thoracic aorta.  IMPRESSION: 1. Support apparatus, as above. 2. No radiographic evidence of acute cardiopulmonary disease. 3. Atherosclerosis.   Electronically Signed   By: Vinnie Langton M.D.   On: 07/20/2014 23:20   Dg Chest Port 1 View  07/25/2014   CLINICAL DATA:  Respiratory failure.  Intracranial hemorrhage  EXAM: PORTABLE CHEST - 1 VIEW  COMPARISON:  No priors.  FINDINGS: Patient is intubated, with the tip of the endotracheal tube approximately 2 cm above the carina. A nasogastric tube is seen extending into the stomach, however, the tip of the nasogastric tube extends below the lower margin of the image. Lung volumes are low. No consolidative airspace disease. No pleural effusions. No pneumothorax. No pulmonary nodule or mass noted. Pulmonary vasculature and the cardiomediastinal silhouette are within normal limits. Atherosclerosis in the thoracic aorta.   IMPRESSION: 1. Support apparatus, as above. Endotracheal tube could be withdrawn 2 cm for more optimal placement. 2. Low lung volumes without radiographic evidence of acute cardiopulmonary disease. 3. Atherosclerosis.   Electronically Signed   By: Vinnie Langton M.D.   On: 07/22/2014 21:54   CT head: 1. 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 There is a large amount of surrounding edema and associated mass effect, with extensive right to left midline shift and evidence suggesting early transtentorial uncal herniation, as above   ASSESSMENT / PLAN:  PULMONARY OETT 8/9 A: VDRF due to altered mental status due to [place] P:   Vent support and wean as tolerated, in the acute setting goal PCO2 between 35-40 to decrease ICP in the first 24 hours VAP bundle Adequate sedation and pain control  CARDIOVASCULAR CVL 8/9 [facility]>> A: HTN P: Controlled with nicardipine gtt but drip currently off  RENAL A:  No acute issues P:   Avoid nephrotoxic meds  GASTROINTESTINAL A:  No acute issue P:   Start tube feed when able  HEMATOLOGIC A:  No acute issue P:  Monitor hb  INFECTIOUS A:  No acute issue P:   BCx2  UC  Sputum Abx:   ENDOCRINE A:  No acute issue P:   Monitor blood sugar, goal 140-180  NEUROLOGIC A:  Large R ICH likely hypertensive bleed, differential include: ICH due to neoplasm, aneurysm, etc P:   BP control with cardene gtt, goal 140 3% saline with goal Na 145-150, BMP Q5 Neurology and NeuroSx consults noted Adequate sedation, RASS goal: -2 Pt is currently full code but this may change.  TODAY'S SUMMARY: Large R ICH, hypertensive. Neuro following. MRI per neuro. 3% saline per neuro.  Richardson Landry Minor ACNP [facility] Pager [phone] till 3 pm If no answer page [phone] 07/28/2014, 11:47 AM  Patient seen and examined with ACNP S.  Minor, agree with assessment and plan.  With the following exceptions  Neurology will discuss DNR with family and continue with current management.   CC time - 68mins  Vilinda Boehringer, MD [facility] [place] Pager [phone] On Call Pager [phone]

## 2014-07-28 NOTE — Progress Notes (Signed)
Pt. Was transported to CT & back to room 3M13 without any complications.  

## 2014-07-28 NOTE — Progress Notes (Signed)
INITIAL NUTRITION ASSESSMENT  DOCUMENTATION CODES Per approved criteria  -Not Applicable   INTERVENTION: Recommended to following TF Regimen: Initiate Jevity 1.2 @ 20 ml/hr via OGT and increase by 10 ml every 4 hours to goal rate of 45 ml/hr. Provide 30 ml Prostat once daily.    Tube feeding regimen plus current rate of propofol will provide 1744 kcal (104% of needs), 75 grams of protein, and 1080 ml of H2O.   If propofol is discontinued, discontinue pro-stat, and increase goal rate of Jevity 1.2 to 60 ml/hr to provide 1728 kcal, 80 grams of protein, and 1166 ml of water.   NUTRITION DIAGNOSIS: Inadequate oral intake related to inability to eat as evidenced by NPO/Vent status.   Goal: Pt to meet >/= 90% of their estimated nutrition needs   Monitor:  Vent status, TF initiation/tolerance, weight trend, labs  Reason for Assessment: Consult for Nutrition Assessment and Tube feeding recommendations  [age] y.o. female  Admitting Dx: <principal problem not specified>  ASSESSMENT: 73 year old female with no significant past medical history. According to her husband, she was in her normal state of health until 6PM today. She was brought by her husband for evaluation of mental status change. 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 Surrounding edema and mass effect were noted with a 10 mm right to left midline shift and early signs of trans-tentorial uncal herniation. She was intubated for airway protection.  No evidence of weight loss per weight history. Patient appears well-nourished. No family at bedside. OGT in place, suction.   Patient is currently intubated on ventilator support MV: 11.3 L/min Temp (24hrs), Avg:99.2 F (37.3 C), Min:96.3 F (35.7 C), Max:100.9 F (38.3 C)  Propofol: 13.2 ml/hr (provides 348 kcal per 24  hours)   Height: Ht Readings from Last 1 Encounters:  07/22/2014 5\' 7"  (1.702 m)    Weight: Wt Readings from Last 1 Encounters:  07/28/14 143 lb 8.3 oz (65.1 kg)    Ideal Body Weight: 135 lbs  % Ideal Body Weight: 106%  Wt Readings from Last 10 Encounters:  07/28/14 143 lb 8.3 oz (65.1 kg)  05/22/14 137 lb (62.143 kg)  06/20/13 136 lb (61.689 kg)  06/20/13 136 lb (61.689 kg)  06/18/13 136 lb 3.2 oz (61.78 kg)    Usual Body Weight: 137 lb (per weight history)  % Usual Body Weight: 105%  BMI:  Body mass index is 22.47 kg/(m^2).  Estimated Nutritional Needs: Kcal: 1675 Protein: 80-90 grams Fluid: 1.7 L/day  Skin: intact  Diet Order: NPO  EDUCATION NEEDS: -No education needs identified at this time   Intake/Output Summary (Last 24 hours) at 07/28/14 1309 Last data filed at 07/28/14 1200  Gross per 24 hour  Intake 2576.86 ml  Output   2405 ml  Net 171.86 ml    Last BM: PTA  Labs:   Recent Labs Lab 08/16/2014 1904 07/19/2014 2115  07/28/14 0359 07/28/14 0553 07/28/14 0845  NA 139 140  < > 145 146 148*  K 3.5* 3.5*  < > 4.0 4.1 3.6*  CL 106 101  < > 110 110 114*  CO2  --  22  < > 21 22 22   BUN 18 16  < > 13 13 11   CREATININE 0.90 0.78  < > 0.66 0.68 0.69  CALCIUM  --  8.9  < > 8.6 8.5 8.3*  MG  --  2.0  --  2.0  --   --   PHOS  --  1.9*  --  2.6  --   --   GLUCOSE 159* 210*  < > 132* 147* 144*  < > = values in this interval not displayed.  CBG (last 3)   Recent Labs  07/28/14 0459 07/28/14 0736 07/28/14 1129  GLUCAP 119* 144* 133*    Scheduled Meds: . antiseptic oral rinse  7 mL Mouth Rinse QID  . chlorhexidine  15 mL Mouth Rinse BID  . insulin aspart  2-6 Units Subcutaneous 6 times per day  . niCARDipine  5 mg/hr Intravenous Once  . pantoprazole (PROTONIX) IV  40 mg Intravenous QHS  . [START ON 07/29/2014] pneumococcal 23 valent vaccine  0.5 mL Intramuscular Tomorrow-1000    Continuous Infusions: . niCARDipine Stopped (07/28/14 0830)   . propofol 30 mcg/kg/min (07/28/14 1200)  . sodium chloride (hypertonic) 100 mL/hr (07/28/14 0517)    Past Medical History  Diagnosis Date  . Cataract of right eye     Past Surgical History  Procedure Laterality Date  . Breast surgery Left     cyst  . Abdominal hysterectomy    . Cataract extraction w/phaco Right 06/20/2013    Procedure: CATARACT EXTRACTION PHACO AND INTRAOCULAR LENS PLACEMENT (IOC);  Surgeon: Gemma Payor, MD;  Location: [facility];  Service: Ophthalmology;  Laterality: Right;  CDE: 16.48  . Cataract extraction w/phaco Left 05/26/2014    Procedure: CATARACT EXTRACTION PHACO AND INTRAOCULAR LENS PLACEMENT (IOC);  Surgeon: Gemma Payor, MD;  Location: [facility];  Service: Ophthalmology;  Laterality: Left;  CDE 14.09    Ian Malkin RD, LDN Inpatient Clinical Dietitian Pager: [phone] After Hours Pager: [phone]

## 2014-07-29 ENCOUNTER — Inpatient Hospital Stay (HOSPITAL_COMMUNITY): Payer: Medicare Other

## 2014-07-29 DIAGNOSIS — I6200 Nontraumatic subdural hemorrhage, unspecified: Secondary | ICD-10-CM | POA: Diagnosis not present

## 2014-07-29 LAB — BLOOD GAS, ARTERIAL
ACID-BASE DEFICIT: 4.5 mmol/L — AB (ref 0.0–2.0)
Bicarbonate: 19.3 mEq/L — ABNORMAL LOW (ref 20.0–24.0)
Drawn by: 331761
FIO2: 0.3 %
O2 Saturation: 98.6 %
PATIENT TEMPERATURE: 98.6
PCO2 ART: 31.3 mmHg — AB (ref 35.0–45.0)
PEEP: 5 cmH2O
PH ART: 7.408 (ref 7.350–7.450)
RATE: 24 resp/min
TCO2: 20.3 mmol/L (ref 0–100)
VT: 460 mL
pO2, Arterial: 118 mmHg — ABNORMAL HIGH (ref 80.0–100.0)

## 2014-07-29 LAB — CBC
HCT: 35.5 % — ABNORMAL LOW (ref 36.0–46.0)
Hemoglobin: 11.8 g/dL — ABNORMAL LOW (ref 12.0–15.0)
MCH: 30.4 pg (ref 26.0–34.0)
MCHC: 33.2 g/dL (ref 30.0–36.0)
MCV: 91.5 fL (ref 78.0–100.0)
PLATELETS: 155 10*3/uL (ref 150–400)
RBC: 3.88 MIL/uL (ref 3.87–5.11)
RDW: 13.6 % (ref 11.5–15.5)
WBC: 17.4 10*3/uL — ABNORMAL HIGH (ref 4.0–10.5)

## 2014-07-29 LAB — BASIC METABOLIC PANEL
Anion gap: 12 (ref 5–15)
BUN: 11 mg/dL (ref 6–23)
CHLORIDE: 126 meq/L — AB (ref 96–112)
CO2: 20 meq/L (ref 19–32)
Calcium: 8.8 mg/dL (ref 8.4–10.5)
Creatinine, Ser: 0.75 mg/dL (ref 0.50–1.10)
GFR calc non Af Amer: 81 mL/min — ABNORMAL LOW (ref >90)
Glucose, Bld: 163 mg/dL — ABNORMAL HIGH (ref 70–99)
POTASSIUM: 3.4 meq/L — AB (ref 3.7–5.3)
SODIUM: 158 meq/L — AB (ref 137–147)

## 2014-07-29 LAB — SODIUM
SODIUM: 161 meq/L — AB (ref 137–147)
Sodium: 160 mEq/L — ABNORMAL HIGH (ref 137–147)
Sodium: 157 mEq/L — ABNORMAL HIGH (ref 137–147)

## 2014-07-29 LAB — GLUCOSE, CAPILLARY
GLUCOSE-CAPILLARY: 130 mg/dL — AB (ref 70–99)
GLUCOSE-CAPILLARY: 136 mg/dL — AB (ref 70–99)
Glucose-Capillary: 125 mg/dL — ABNORMAL HIGH (ref 70–99)
Glucose-Capillary: 143 mg/dL — ABNORMAL HIGH (ref 70–99)
Glucose-Capillary: 150 mg/dL — ABNORMAL HIGH (ref 70–99)
Glucose-Capillary: 165 mg/dL — ABNORMAL HIGH (ref 70–99)

## 2014-07-29 MED ORDER — PRO-STAT SUGAR FREE PO LIQD
30.0000 mL | Freq: Every day | ORAL | Status: DC
  Administered 2014-07-29 – 2014-08-02 (×5): 30 mL
  Filled 2014-07-29 (×6): qty 30

## 2014-07-29 MED ORDER — NICARDIPINE HCL IN NACL 40-0.83 MG/200ML-% IV SOLN
3.0000 mg/h | INTRAVENOUS | Status: DC
  Administered 2014-07-29: 3 mg/h via INTRAVENOUS
  Administered 2014-07-29: 10 mg/h via INTRAVENOUS
  Administered 2014-07-30: 3 mg/h via INTRAVENOUS
  Filled 2014-07-29 (×4): qty 200

## 2014-07-29 MED ORDER — POTASSIUM CHLORIDE 20 MEQ PO PACK: 40.0000 meq | PACK | Freq: Once | ORAL | Status: DC

## 2014-07-29 MED ORDER — POTASSIUM CHLORIDE 20 MEQ/15ML (10%) PO LIQD
40.0000 meq | Freq: Once | ORAL | Status: AC
  Administered 2014-07-29: 40 meq
  Filled 2014-07-29: qty 30

## 2014-07-29 MED ORDER — JEVITY 1.2 CAL PO LIQD
1000.0000 mL | ORAL | Status: DC
  Administered 2014-07-29: 18:00:00
  Administered 2014-07-30 – 2014-07-31 (×2): 1000 mL
  Administered 2014-08-01 – 2014-08-02 (×2)
  Filled 2014-07-29 (×8): qty 1000

## 2014-07-29 NOTE — Progress Notes (Signed)
NUTRITION FOLLOW-UP/CONSULT  INTERVENTION: Initiate Jevity 1.2 @ 25 ml/hr via OGT and increase by 10 ml every 4 hours to goal rate of 45 ml/hr. Provide 30 ml Prostat once daily.    Tube feeding regimen plus current rate of propofol will provide 1784 kcal (106% of needs), 75 grams of protein, and 1080 ml of H2O.   NUTRITION DIAGNOSIS: Inadequate oral intake related to inability to eat as evidenced by NPO/Vent status; ongoing.    Goal: Pt to meet >/= 90% of their estimated nutrition needs, not yet met.   Monitor:  Vent status, TF initiation/tolerance, weight trend, labs  ASSESSMENT: 73-year-old female with no significant past medical history. CT head showed large acute hemorrhagic infarction. She was intubated for airway protection.  OG tube in place.   Patient is currently intubated on ventilator support MV: 11.4 L/min Temp (24hrs), Avg:99.4 F (37.4 C), Min:98.4 F (36.9 C), Max:100.8 F (38.2 C)  Propofol: 14.7 ml/hr (provides 388 kcal per 24 hours) Received consult to start nutrition.  Potassium low. CBG's: 130-143  Height: Ht Readings from Last 1 Encounters:  07/26/2014 5' 7" (1.702 m)    Weight: Wt Readings from Last 1 Encounters:  07/29/14 148 lb 5.9 oz (67.3 kg)  Admission weight: 143 lb (65.1 kg) 8/9 Usual weight: 137 lb (62.2 kg)  BMI:  Body mass index is 23.23 kg/(m^2).  Estimated Nutritional Needs: Kcal: 1675 Protein: 80-90 grams Fluid: 1.7 L/day  Skin: intact  Diet Order: NPO   Intake/Output Summary (Last 24 hours) at 07/29/14 1229 Last data filed at 07/29/14 1200  Gross per 24 hour  Intake 2289.81 ml  Output   2625 ml  Net -335.19 ml    Last BM: PTA  Labs:   Recent Labs Lab 08/02/2014 1904 07/26/2014 2115  07/28/14 0359 07/28/14 0553 07/28/14 0845  07/28/14 2034 07/29/14 0256 07/29/14 0915  NA 139 140  < > 145 146 148*  < > 151* 158* 161*  K 3.5* 3.5*  < > 4.0 4.1 3.6*  --   --  3.4*  --   CL 106 101  < > 110 110 114*  --   --   126*  --   CO2  --  22  < > 21 22 22  --   --  20  --   BUN 18 16  < > 13 13 11  --   --  11  --   CREATININE 0.90 0.78  < > 0.66 0.68 0.69  --   --  0.75  --   CALCIUM  --  8.9  < > 8.6 8.5 8.3*  --   --  8.8  --   MG  --  2.0  --  2.0  --   --   --   --   --   --   PHOS  --  1.9*  --  2.6  --   --   --   --   --   --   GLUCOSE 159* 210*  < > 132* 147* 144*  --   --  163*  --   < > = values in this interval not displayed.  CBG (last 3)   Recent Labs  07/29/14 0404 07/29/14 0806 07/29/14 1142  GLUCAP 143* 130* 136*    Scheduled Meds: . antiseptic oral rinse  7 mL Mouth Rinse QID  . chlorhexidine  15 mL Mouth Rinse BID  . insulin aspart  2-6 Units Subcutaneous 6 times   per day  . niCARDipine  5 mg/hr Intravenous Once  . pantoprazole (PROTONIX) IV  40 mg Intravenous QHS    Continuous Infusions: . niCARDipine 2.5 mg/hr (07/29/14 1200)  . propofol 45 mcg/kg/min (07/29/14 1200)  . sodium chloride (hypertonic) Stopped (07/29/14 0315)     Heather Pitts RD, LDN, CNSC [phone] Pager [phone] After Hours Pager  

## 2014-07-29 NOTE — Progress Notes (Signed)
Stroke Team Progress Note  HISTORY Rachael Rivas is an 73 y.o. female with no known medical history who was taken to the emergency room at [facility] following acute onset headache and nausea and vomiting as well as left side weakness at 6 PM today. CT scan of the head showed large acute hemorrhagic infarction centered in the right parietal and occipital regions with associated subdural extension along the tentorium cerebelli and falx cerebri. Surrounding edema and mass effect were noted with a 10 mm right to left midline shift and early signs of trans-tentorial uncal herniation. Patient showed signs of rapid deterioration including increase in pupillary size of the right and reduced responsiveness as well as increased weakness of left extremities. Blood pressure was markedly elevated at 200/110. She was intubated and placed on mechanical ventilation as well as started on Cardene drip prior to transfer to [facility]. Neurosurgery was consulted and recommended no surgical intervention.  LSN: 6 PM on 07/24/2014  tPA Given: No: Acute ICH     She was admitted to the neuro ICU for further evaluation and treatment.  SUBJECTIVE Her family is not at bedside.  She had rapidly rising sodium y`day hence 3% saline was on hold but sodium is yet 158 this am. Low grade fever 100. Sugars ok. Tube feeds not started yet. For MRI later today. Echo and carotid dopplers ok. LDL elevated. OBJECTIVE Most recent Vital Signs: Filed Vitals:   07/29/14 0800 07/29/14 0815 07/29/14 0820 07/29/14 0830  BP: 98/51 142/59 142/59 145/88  Pulse: 89 114 114 127  Temp: 99.1 F (37.3 C) 99 F (37.2 C)  99 F (37.2 C)  TempSrc:      Resp: 24 24 26 23   Height:      Weight:      SpO2: 100% 99% 100% 99%   CBG (last 3)   Recent Labs  07/28/14 2337 07/29/14 0404 07/29/14 0806  GLUCAP 136* 143* 130*    IV Fluid Intake:   . niCARDipine Stopped (07/29/14 0800)  . propofol Stopped (07/29/14 0800)  . sodium chloride (hypertonic) Stopped  (07/29/14 0315)    MEDICATIONS  . antiseptic oral rinse  7 mL Mouth Rinse QID  . chlorhexidine  15 mL Mouth Rinse BID  . insulin aspart  2-6 Units Subcutaneous 6 times per day  . niCARDipine  5 mg/hr Intravenous Once  . pantoprazole (PROTONIX) IV  40 mg Intravenous QHS  . pneumococcal 23 valent vaccine  0.5 mL Intramuscular Tomorrow-1000   PRN:  sodium chloride, acetaminophen, fentaNYL  Diet:  NPO   Activity:  Bedrest    DVT Prophylaxis: SCDs  CLINICALLY SIGNIFICANT STUDIES Basic Metabolic Panel:  Recent Labs Lab 08/07/2014 2115  07/28/14 0359  07/28/14 0845  07/28/14 2034 07/29/14 0256  NA 140  < > 145  < > 148*  < > 151* 158*  K 3.5*  < > 4.0  < > 3.6*  --   --  3.4*  CL 101  < > 110  < > 114*  --   --  126*  CO2 22  < > 21  < > 22  --   --  20  GLUCOSE 210*  < > 132*  < > 144*  --   --  163*  BUN 16  < > 13  < > 11  --   --  11  CREATININE 0.78  < > 0.66  < > 0.69  --   --  0.75  CALCIUM 8.9  < >  8.6  < > 8.3*  --   --  8.8  MG 2.0  --  2.0  --   --   --   --   --   PHOS 1.9*  --  2.6  --   --   --   --   --   < > = values in this interval not displayed. Liver Function Tests:   Recent Labs Lab 12/08/2014 1850 12/08/2014 2115  AST 27 31  ALT 15 17  ALKPHOS 119* 116  BILITOT 0.4 1.0  PROT 7.6 7.8  ALBUMIN 4.2 4.4   CBC:  Recent Labs Lab 12/08/2014 1850  12/08/2014 2115 07/28/14 0359 07/29/14 0256  WBC 9.7  --  24.4* 14.8* 17.4*  NEUTROABS 5.4  --  21.0*  --   --   HGB 13.7  < > 14.0 12.1 11.8*  HCT 40.7  < > 41.3 36.9 35.5*  MCV 91.1  --  91.0 90.9 91.5  PLT 208  --  192 163 155  < > = values in this interval not displayed. Coagulation:   Recent Labs Lab 12/08/2014 1850  LABPROT 13.8  INR 1.06   Cardiac Enzymes:   Recent Labs Lab 12/08/2014 2115 07/28/14 0200 07/28/14 0845  TROPONINI <0.30 <0.30 <0.30   Urinalysis: No results found for this basename: COLORURINE, APPERANCEUR, LABSPEC, PHURINE, GLUCOSEU, HGBUR, BILIRUBINUR, KETONESUR, PROTEINUR,  UROBILINOGEN, NITRITE, LEUKOCYTESUR,  in the last 168 hours Lipid Panel    Component Value Date/Time   CHOL 215* 08/02/2014 2115   TRIG 147 08/07/2014 2115   TRIG 150* 08/02/2014 2115   HDL 50 07/24/2014 2115   CHOLHDL 4.3 08/07/2014 2115   VLDL 30 08/15/2014 2115   LDLCALC 135* 08/14/2014 2115   HgbA1C  Lab Results  Component Value Date   HGBA1C 5.9* 07/30/2014    Urine Drug Screen:     Component Value Date/Time   LABOPIA NONE DETECTED 08/05/2014 2306   COCAINSCRNUR NONE DETECTED 08/01/2014 2306   LABBENZ POSITIVE* 07/21/2014 2306   AMPHETMU NONE DETECTED 08/13/2014 2306   THCU NONE DETECTED 07/20/2014 2306   LABBARB NONE DETECTED 08/11/2014 2306    Alcohol Level: No results found for this basename: ETH,  in the last 168 hours  Ct Head Wo Contrast  07/28/2014   CLINICAL DATA:  Follow-up intracranial hemorrhage.  EXAM: CT HEAD WITHOUT CONTRAST  TECHNIQUE: Contiguous axial images were obtained from the base of the skull through the vertex without intravenous contrast.  COMPARISON:  CT of the head performed 2014/04/30  FINDINGS: There has been mild interval improvement in the amount of intraparenchymal blood at the right parietal lobe, reflecting interval evolution of the patient's hemorrhagic infarct. Underlying decreased attenuation is noted at the right parietal and occipital lobes. Blood tracking along the right tentorium cerebelli is relatively stable in appearance.  There is mildly increased subdural blood tracking along the right side of the posterior falx cerebri. An enlarging right-sided subdural hematoma is noted overlying the right frontoparietal region, measuring approximately 8 mm. There is worsening leftward midline shift, measuring up to 1.8 cm more superiorly.  There appears to be slightly worsened effacement of the cisterns about the cerebral peduncles, with stable decreased attenuation at the right cerebral peduncle. Mild transtentorial herniation cannot be excluded.  There may be slightly  increased distention of the temporal horn of the left lateral ventricle, raising concern for mild hydrocephalus. There is partial effacement of the right lateral ventricle.  There is no evidence of fracture; visualized  osseous structures are unremarkable in appearance. The orbits are within normal limits. The paranasal sinuses and mastoid air cells are well-aerated. No significant soft tissue abnormalities are seen.  IMPRESSION: 1. Increasing right-sided subdural hematoma overlying the right frontoparietal region, measuring approximately 8 mm. Worsening leftward midline shift noted, measuring up to 1.8 cm superiorly, increased from 1.3 cm on the prior study. 2. Slightly increased effacement of the cisterns about the cerebral peduncles. As before, mild transtentorial herniation cannot be excluded. Partial effacement of the right lateral ventricle. There may be slightly increased distention of the temporal horn of the left lateral ventricle, raising concern for mild hydrocephalus. 3. Mildly increased subdural blood along the right side of the posterior falx cerebri. Blood along the right tentorium cerebelli is relatively stable in appearance. 4. Mild interval improvement in the amount of intraparenchymal blood of the right parietal lobe; underlying infarct is relatively stable.  These results were called by telephone at the time of interpretation on 07/28/2014 at 5:05 am to [facility] on [facility] 48M, who verbally acknowledged these results.   Electronically Signed   By: Roanna Raider M.D.   On: 07/28/2014 05:11   Ct Head Wo Contrast  08/02/2014   CLINICAL DATA:  Code stroke.  Left-sided weakness.  Confusion.  EXAM: CT HEAD WITHOUT CONTRAST  TECHNIQUE: Contiguous axial images were obtained from the base of the skull through the vertex without intravenous contrast.  COMPARISON:  No priors.  FINDINGS: Large intraparenchymal hemorrhage (measuring up to 6.8 x 4.7 cm) occupying much of the right parietal region and right  occipital region, with surrounding low-attenuation (adjacent edema). There is also some high attenuation material layering along the right falx cerebri and tentorium cerebelli measuring up to 13 mm in thickness, presumably some subpleural component of this hemorrhage. This hemorrhage and the surrounding edema is exerting mass effect as evidenced by 11 mm of right to left midline shift. This is compressing the temporal and posterior aspect of the right lateral ventricle, and there is some effacement of the basal cisterns (particularly the suprasellar cistern) on the right side, concerning for early transtentorial uncal herniation. No definite intraventricular extension of hemorrhage. No associated hydrocephalus at this time. All paranasal sinuses and mastoids are well pneumatized. No acute displaced skull fractures are noted.  IMPRESSION: 1. 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 There is a large amount of surrounding edema and associated mass effect, with extensive right to left midline shift and evidence suggesting early transtentorial uncal herniation, as above. Critical Value/emergent results were called by telephone at the time of interpretation on 07/22/2014 at 7:07 pm to Dr. Geoffery Lyons, who verbally acknowledged these results.   Electronically Signed   By: Trudie Reed M.D.   On: 07/22/2014 19:10   Dg Chest Port 1 View  07/29/2014   CLINICAL DATA:  Check endotracheal tube placement  EXAM: PORTABLE CHEST - 1 VIEW  COMPARISON:  08/08/2014  FINDINGS: An endotracheal tube is noted 4.8 cm above the carina. A nasogastric catheter is seen coiled within the stomach. A left central venous line is again noted and stable. Cardiac shadow is stable. The lungs are well-aerated without evidence of focal infiltrate or sizable effusion.  IMPRESSION: Tubes and lines as described  above.  No acute abnormality is seen.   Electronically Signed   By: Alcide Clever M.D.   On: 07/29/2014 07:46   Dg Chest  Port 1 View  08-26-2014   CLINICAL DATA:  Central line placement.  EXAM: PORTABLE CHEST - 1 VIEW  COMPARISON:  Chest x-ray August 26, 2014.  FINDINGS: An endotracheal tube is in place with tip 3.3 cm above the carina. There is a Left-sided internal jugular central venous catheter with tip terminating in the distal superior vena cava. A nasogastric tube is seen extending into the stomach, however, the tip of the nasogastric tube extends below the lower margin of the image. An electronic device projects over the left apex limiting assessment for are post procedural pneumothorax. With this limitation in mind, no definite pneumothorax is noted. No acute consolidative airspace disease. No pleural effusions. No evidence of pulmonary edema. Heart size is normal. Upper mediastinal contours are within normal limits. Atherosclerosis in the thoracic aorta.  IMPRESSION: 1. Support apparatus, as above. 2. No radiographic evidence of acute cardiopulmonary disease. 3. Atherosclerosis.   Electronically Signed   By: Trudie Reed M.D.   On: 26-Aug-2014 23:20   Dg Chest Port 1 View  26-Aug-2014   CLINICAL DATA:  Respiratory failure.  Intracranial hemorrhage  EXAM: PORTABLE CHEST - 1 VIEW  COMPARISON:  No priors.  FINDINGS: Patient is intubated, with the tip of the endotracheal tube approximately 2 cm above the carina. A nasogastric tube is seen extending into the stomach, however, the tip of the nasogastric tube extends below the lower margin of the image. Lung volumes are low. No consolidative airspace disease. No pleural effusions. No pneumothorax. No pulmonary nodule or mass noted. Pulmonary vasculature and the cardiomediastinal silhouette are within normal limits. Atherosclerosis in the thoracic aorta.  IMPRESSION: 1. Support apparatus, as above. Endotracheal tube could be withdrawn 2 cm for more optimal  placement. 2. Low lung volumes without radiographic evidence of acute cardiopulmonary disease. 3. Atherosclerosis.   Electronically Signed   By: Trudie Reed M.D.   On: 2014-08-26 21:54    MRI of the brain  ordered  MRA of the brain  ordered  Carotid Doppler 1-39% bilateral stenosis  2D Echocardiogram  Left ventricle: The cavity size was normal. Wall thickness was normal. Systolic function was normal. The estimated ejection fraction was in the range of 55% to 60%. Wall motion was normal; there were no regional wall motion abnormalities. Doppler parameters are consistent with abnormal left ventricular relaxation (grade 1 diastolic dysfunction).   CXR  No radiographic evidence of acute cardiopulmonary disease.      EKG    Sinus tachycardia Nonspecific ST abnormality  Therapy Recommendations pending Physical Exam  Frail elderly caucasian lady not in distress..Intubated. Afebrile. Head is nontraumatic. Neck is supple without bruit.   Cardiac exam no murmur or gallop. Lungs are clear to auscultation. Distal pulses are well felt. Neurological Exam ; examined off Propofol. Stuporose.. Not following any commandsPupils are 4 mm reactive. Corneal reflexes are present. Dolls eye movements are sluggish. Fundi were not visualized. No obvious facial weakness. Tongue midline. Moves right side spontaneously and semi-purposefully against gravity. Mild left lower  And upper extremity movements. to painful stimuli. Tone is decreased on the left. Left plantar is upgoing right is downgoing. ASSESSMENT Rachael Rivas is a 73 y.o. female presenting with sudden onset of mental status changes with nausea vomiting. Imaging confirms a right parieto-occipital large intracerebral hematoma with surrounding cytotoxic edema and 17mm right-to-left midline shift and cerebral herniation. There is also associated tentorial subdural and right convexity subdural hematoma. Etiology of the hemorrhage is indeterminate  differential includes hypertensive  (given adm BP  200/110) versus amyloid angiopathy. Hemorrhagic infarct or metastasis would be less .    On no antiplatelets/anticoagulants prior to admission. And there is no h/o of trauma or fall. Patient with resultant comatose state with left hemiparesis Stroke work up underway.   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 Not on statins prior to admission  Respiratory failure secondary to cerebral edema from large brain hemorrhage  Induced Hypernatremia to control cerebral edema   Hospital day # 2  TREATMENT/PLAN  Continue ICU level care with aggressive blood pressure control and keep SBP below   180/100.  Keep normothermic, normoglycemic and euvolemic. Hold hypertonic saline as serum sodium above goal   Check MRI scan of the brain  DVT and GI prophylaxis. Start tube feeds for nutrition. Keep I =O Prognosis is guarded. I had a long 20 minute discussion with the patient's husband and brother-in-law at the bedside y`day regarding her clinical presentation, my evaluation, prognosis, plan of care and answered questions. He agrees to DO NOT RESUSCITATE but wants to continue present therapy for now. Discuss with Dr.Mungal-PCCM This patient is critically ill and at significant risk of neurological worsening, death and care requires constant monitoring of vital signs, hemodynamics,respiratory and cardiac monitoring,review of multiple databases, neurological assessment, discussion with family, other specialists and medical decision making of high complexity.I have made any additions or clarifications directly to the above note.  I spent 35 minutes of neurocritical care time  in the care of  this patient. Delia Heady, MD      To contact Stroke Continuity provider, please refer to [email or web address]. After hours, contact General Neurology

## 2014-07-29 NOTE — Progress Notes (Signed)
07/29/2014- Resp care note- Pt transported to and from MRI on ventilator.  Pt tolerated transport well.  Pt monitored during procedure by resp therapy.

## 2014-07-29 NOTE — Progress Notes (Signed)
PULMONARY / CRITICAL CARE MEDICINE   Name: STUART GUILLEN MRN: [id number] DOB: [birth year]    ADMISSION DATE:  08/02/2014  CHIEF COMPLAINT:  Headache  INITIAL PRESENTATION:   STUDIES:  CT head 8/9:R Parietal ICH Carotid Duplex 8/10 Bilateral: 1-39% ICA stenosis. Vertebral artery flow is antegrade.   SIGNIFICANT EVENTS: 8/9 intubated 8/9 CVL insertion, 3% started 8/10 Neuro met with family agreed on DNR status  HISTORY OF PRESENT ILLNESS:  Patient unable to give history due to altered mental status. History taken from chart review. She is a 73 year old female with no significant past medical history. According to her husband, she was in her normal state of health until 6PM today. She was brought by her husband for evaluation of mental status change. She was at church this evening and developed the sudden onset of headache behind her right eye. She became less responsive and confused and vomited en route. Blood pressure was markedly elevated at 200/110. 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 Surrounding edema and mass effect were noted with a 10 mm right to left midline shift and early signs of trans-tentorial uncal herniation. Patient showed signs of rapid deterioration including increase in pupillary size of the right and reduced responsiveness as well as increased weakness of left extremities and she was intubated for airway protection. Nicardipine drip started in ED.  Non smoker, no EtOH, no drug abuse. Does not take any medications.    SUBJECTIVE:  Sedated on vent, 3% saline infusing VITAL SIGNS: Temp:  [98.4 F (36.9 C)-100.9 F (38.3 C)] 99 F (37.2 C) (08/11 1000) Pulse Rate:  [83-127] 108 (08/11 1000) Resp:  [17-28] 24 (08/11 1000) BP: (98-173)/(44-91) 132/75 mmHg (08/11 1000) SpO2:  [99 %-100 %] 100 % (08/11 1000) FiO2 (%):  [30  %] 30 % (08/11 1000) Weight:  [148 lb 5.9 oz (67.3 kg)] 148 lb 5.9 oz (67.3 kg) (08/11 0403) HEMODYNAMICS:   VENTILATOR SETTINGS: Vent Mode:  [-] PRVC FiO2 (%):  [30 %] 30 % Set Rate:  [24 bmp] 24 bmp Vt Set:  [460 mL] 460 mL PEEP:  [5 cmH20] 5 cmH20 Plateau Pressure:  [12 cmH20-16 cmH20] 13 cmH20 INTAKE / OUTPUT:  Intake/Output Summary (Last 24 hours) at 07/29/14 1030 Last data filed at 07/29/14 0900  Gross per 24 hour  Intake 2451.81 ml  Output   2785 ml  Net -333.19 ml    PHYSICAL EXAMINATION: General:  Intubated, sedated, does not follow commands Neuro:  Has mild decerebrate posturing R>L, able to localize pain on the left, withdraws to pain on the right, no facial droop HEENT:  Atraumatic, pupils equal and sluggish, ET tube in place Cardiovascular:  RRR, no loud murmur Lungs:  No wheeze, has intermittent rhonchi Abdomen:  Soft, nontender, no guarding, no bs noted Musculoskeletal:  No gross deformities, no edema Skin:  No ecchymosis, warm and dry  LABS:  CBC  Recent Labs Lab 08/02/2014 2115 07/28/14 0359 07/29/14 0256  WBC 24.4* 14.8* 17.4*  HGB 14.0 12.1 11.8*  HCT 41.3 36.9 35.5*  PLT 192 163 155   Coag's  Recent Labs Lab 08/10/2014 1850  APTT 24  INR 1.06   BMET  Recent Labs Lab 07/28/14 0553 07/28/14 0845  07/28/14 2034 07/29/14 0256 07/29/14 0915  NA 146 148*  < > 151* 158* 161*  K 4.1 3.6*  --   --  3.4*  --  CL 110 114*  --   --  126*  --   CO2 22 22  --   --  20  --   BUN 13 11  --   --  11  --   CREATININE 0.68 0.69  --   --  0.75  --   GLUCOSE 147* 144*  --   --  163*  --   < > = values in this interval not displayed. Electrolytes  Recent Labs Lab 07/29/2014 2115  07/28/14 0359 07/28/14 0553 07/28/14 0845 07/29/14 0256  CALCIUM 8.9  < > 8.6 8.5 8.3* 8.8  MG 2.0  --  2.0  --   --   --   PHOS 1.9*  --  2.6  --   --   --   < > = values in this interval not displayed. Sepsis Markers  Recent Labs Lab 08/10/2014 2115   LATICACIDVEN 2.5*   ABG  Recent Labs Lab 08/12/2014 2258 07/28/14 0542 07/29/14 0500  PHART 7.450 7.440 7.408  PCO2ART 32.9* 31.7* 31.3*  PO2ART 91.1 93.9 118.0*   Liver Enzymes  Recent Labs Lab 08/14/2014 1850 07/22/2014 2115  AST 27 31  ALT 15 17  ALKPHOS 119* 116  BILITOT 0.4 1.0  ALBUMIN 4.2 4.4   Cardiac Enzymes  Recent Labs Lab 07/29/2014 2115 07/28/14 0200 07/28/14 0845  TROPONINI <0.30 <0.30 <0.30   Glucose  Recent Labs Lab 07/28/14 1129 07/28/14 1532 07/28/14 1955 07/28/14 2337 07/29/14 0404 07/29/14 0806  GLUCAP 133* 125* 133* 136* 143* 130*    Imaging Ct Head Wo Contrast  07/28/2014   CLINICAL DATA:  Follow-up intracranial hemorrhage.  EXAM: CT HEAD WITHOUT CONTRAST  TECHNIQUE: Contiguous axial images were obtained from the base of the skull through the vertex without intravenous contrast.  COMPARISON:  CT of the head performed 08/08/2014  FINDINGS: There has been mild interval improvement in the amount of intraparenchymal blood at the right parietal lobe, reflecting interval evolution of the patient's hemorrhagic infarct. Underlying decreased attenuation is noted at the right parietal and occipital lobes. Blood tracking along the right tentorium cerebelli is relatively stable in appearance.  There is mildly increased subdural blood tracking along the right side of the posterior falx cerebri. An enlarging right-sided subdural hematoma is noted overlying the right frontoparietal region, measuring approximately 8 mm. There is worsening leftward midline shift, measuring up to 1.8 cm more superiorly.  There appears to be slightly worsened effacement of the cisterns about the cerebral peduncles, with stable decreased attenuation at the right cerebral peduncle. Mild transtentorial herniation cannot be excluded.  There may be slightly increased distention of the temporal horn of the left lateral ventricle, raising concern for mild hydrocephalus. There is partial  effacement of the right lateral ventricle.  There is no evidence of fracture; visualized osseous structures are unremarkable in appearance. The orbits are within normal limits. The paranasal sinuses and mastoid air cells are well-aerated. No significant soft tissue abnormalities are seen.  IMPRESSION: 1. Increasing right-sided subdural hematoma overlying the right frontoparietal region, measuring approximately 8 mm. Worsening leftward midline shift noted, measuring up to 1.8 cm superiorly, increased from 1.3 cm on the prior study. 2. Slightly increased effacement of the cisterns about the cerebral peduncles. As before, mild transtentorial herniation cannot be excluded. Partial effacement of the right lateral ventricle. There may be slightly increased distention of the temporal horn of the left lateral ventricle, raising concern for mild hydrocephalus. 3. Mildly increased subdural blood along the right  side of the posterior falx cerebri. Blood along the right tentorium cerebelli is relatively stable in appearance. 4. Mild interval improvement in the amount of intraparenchymal blood of the right parietal lobe; underlying infarct is relatively stable.  These results were called by telephone at the time of interpretation on 07/28/2014 at 5:05 am to [facility] on [address], who verbally acknowledged these results.   Electronically Signed   By: Garald Balding M.D.   On: 07/28/2014 05:11   CT head: 1. 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 There is a large amount of surrounding edema and associated mass effect, with extensive right to left midline shift and evidence suggesting early transtentorial uncal herniation, as above   ASSESSMENT / PLAN:  PULMONARY OETT 8/9 A: VDRF due to altered mental status due to [place] P:   Vent support and wean as tolerated, in the acute setting goal  PCO2 between 35-40 to decrease ICP in the first 24 hours VAP bundle Adequate sedation and pain control  CARDIOVASCULAR CVL 8/9 [facility]>> A: HTN P: Controlled with nicardipine gtt but drip currently off  RENAL A:  No acute issues P:   Avoid nephrotoxic meds  GASTROINTESTINAL A:  No acute issue P:   Start tube feeds today  HEMATOLOGIC A:  No acute issue P:  Monitor hb  INFECTIOUS A:  No acute issue P:   Cont with monitor   ENDOCRINE A:  No acute issue P:   Monitor blood sugar, goal 140-180  NEUROLOGIC A:  Large R ICH likely hypertensive bleed, differential include: ICH due to neoplasm, aneurysm, etc P:   BP control with cardene gtt, goal 140 3% saline with goal Na 150-155, Q6 Na Neurology and NeuroSx consults noted Adequate sedation, RASS goal: -2 Pt is currently full code but this may change.  TODAY'S SUMMARY: Large R ICH, hypertensive. Neuro following. MRI per neuro. 3% saline on hold per neuro. Neurology will discussed DNR with family, who is in agreement, and will continue with current management.   CC time - 1mins  Vilinda Boehringer, MD  Pulmonary and Critical Care Pager [phone] On Call Pager [phone]

## 2014-07-30 DIAGNOSIS — E870 Hyperosmolality and hypernatremia: Secondary | ICD-10-CM

## 2014-07-30 DIAGNOSIS — J9600 Acute respiratory failure, unspecified whether with hypoxia or hypercapnia: Secondary | ICD-10-CM

## 2014-07-30 LAB — CBC
HEMATOCRIT: 33.5 % — AB (ref 36.0–46.0)
Hemoglobin: 11.1 g/dL — ABNORMAL LOW (ref 12.0–15.0)
MCH: 30.5 pg (ref 26.0–34.0)
MCHC: 33.1 g/dL (ref 30.0–36.0)
MCV: 92 fL (ref 78.0–100.0)
PLATELETS: 159 10*3/uL (ref 150–400)
RBC: 3.64 MIL/uL — ABNORMAL LOW (ref 3.87–5.11)
RDW: 14.1 % (ref 11.5–15.5)
WBC: 18.2 10*3/uL — ABNORMAL HIGH (ref 4.0–10.5)

## 2014-07-30 LAB — BASIC METABOLIC PANEL
Anion gap: 13 (ref 5–15)
BUN: 24 mg/dL — AB (ref 6–23)
CALCIUM: 9.3 mg/dL (ref 8.4–10.5)
CO2: 22 meq/L (ref 19–32)
CREATININE: 0.79 mg/dL (ref 0.50–1.10)
Chloride: 125 mEq/L — ABNORMAL HIGH (ref 96–112)
GFR calc Af Amer: >90 mL/min (ref >90)
GFR, EST NON AFRICAN AMERICAN: 80 mL/min — AB (ref >90)
GLUCOSE: 163 mg/dL — AB (ref 70–99)
Potassium: 3.1 mEq/L — ABNORMAL LOW (ref 3.7–5.3)
Sodium: 160 mEq/L — ABNORMAL HIGH (ref 137–147)

## 2014-07-30 LAB — GLUCOSE, CAPILLARY
GLUCOSE-CAPILLARY: 133 mg/dL — AB (ref 70–99)
GLUCOSE-CAPILLARY: 174 mg/dL — AB (ref 70–99)
Glucose-Capillary: 174 mg/dL — ABNORMAL HIGH (ref 70–99)
Glucose-Capillary: 145 mg/dL — ABNORMAL HIGH (ref 70–99)
Glucose-Capillary: 156 mg/dL — ABNORMAL HIGH (ref 70–99)

## 2014-07-30 LAB — SODIUM
Sodium: 158 meq/L — ABNORMAL HIGH (ref 137–147)
Sodium: 160 mEq/L — ABNORMAL HIGH (ref 137–147)
Sodium: 160 meq/L — ABNORMAL HIGH (ref 137–147)

## 2014-07-30 LAB — TRIGLYCERIDES: TRIGLYCERIDES: 266 mg/dL — AB (ref <150)

## 2014-07-30 MED ORDER — POTASSIUM CHLORIDE 20 MEQ/15ML (10%) PO LIQD
40.0000 meq | Freq: Once | ORAL | Status: AC
  Administered 2014-07-30: 40 meq
  Filled 2014-07-30: qty 30

## 2014-07-30 MED ORDER — ENOXAPARIN SODIUM 40 MG/0.4ML ~~LOC~~ SOLN
40.0000 mg | SUBCUTANEOUS | Status: DC
  Administered 2014-07-30 – 2014-08-02 (×4): 40 mg via SUBCUTANEOUS
  Filled 2014-07-30 (×4): qty 0.4

## 2014-07-30 NOTE — Progress Notes (Addendum)
Shanda BumpsJessica from pharmacy called RN regarding patient's Na+ levels increasing too rapidly. MD notified of patient's Na+ of 152. 3% Na+ stopped per verbal MD conversation at 1600. Will draw Na at 2100. Night RN will call Neuro MD on call for further instructions.

## 2014-07-30 NOTE — Progress Notes (Addendum)
Called [facility]-link to notify them of a change in the patient's rhythm. It appeared to be a flutter like rhythm. Electrodes were changed and the patient's rhythm continued. The patient was asymptomatic and her assessment did not change. [facility] stated that it was interference with the ECG and no further evaluation was needed. Will continue to monitor.

## 2014-07-30 NOTE — Progress Notes (Signed)
Stroke Team Progress Note  HISTORY Rachael Rivas is an 73 y.o. female with no known medical history who was taken to the emergency room at [facility] following acute onset headache and nausea and vomiting as well as left side weakness at 6 PM today. CT scan of the head showed large acute hemorrhagic infarction centered in the right parietal and occipital regions with associated subdural extension along the tentorium cerebelli and falx cerebri. Surrounding edema and mass effect were noted with a 10 mm right to left midline shift and early signs of trans-tentorial uncal herniation. Patient showed signs of rapid deterioration including increase in pupillary size of the right and reduced responsiveness as well as increased weakness of left extremities. Blood pressure was markedly elevated at 200/110. She was intubated and placed on mechanical ventilation as well as started on Cardene drip prior to transfer to [facility]. Neurosurgery was consulted and recommended no surgical intervention.  LSN: 6 PM on 08/06/2014  tPA Given: No: Acute ICH     She was admitted to the neuro ICU for further evaluation and treatment.  SUBJECTIVE Her  husband and brother in law are at bedside.   3% saline is on hold but sodium is yet 160 this am. Low grade fever 100. Sugars ok. Tube feeds   Started input and output are matched for last 24 hours. MRI scan of the brain personally reviewed showed large parenchymal right posterior temporal, parietal and occipital hematoma with small adjacent subdural. snd stable 11.6 mm right to left midline shift. There were also tiny diffusion-positive infarcts in the left periinsular and right corona radiata and etiology of hematoma yet remains indeterminate. MRA of the brain shows no large vessel occlusion or stenosis OBJECTIVE Most recent Vital Signs: Filed Vitals:   07/30/14 0630 07/30/14 0700 07/30/14 0729 07/30/14 0800  BP: 143/65 139/62 108/53 153/68  Pulse: 114 111 101 120  Temp: 99.9 F (37.7 C)  99.9 F (37.7 C)  99.7 F (37.6 C)  TempSrc:      Resp: 21 19 24 23   Height:      Weight:      SpO2: 100% 100% 100% 99%   CBG (last 3)   Recent Labs  07/29/14 2341 07/30/14 0359 07/30/14 0737  GLUCAP 150* 174* 133*    IV Fluid Intake:   . feeding supplement (JEVITY 1.2 CAL) 1,000 mL (07/30/14 0800)  . niCARDipine 5 mg/hr (07/30/14 0800)  . propofol 20 mcg/kg/min (07/30/14 0925)  . sodium chloride (hypertonic) Stopped (07/29/14 0315)    MEDICATIONS  . antiseptic oral rinse  7 mL Mouth Rinse QID  . chlorhexidine  15 mL Mouth Rinse BID  . feeding supplement (PRO-STAT SUGAR FREE 64)  30 mL Per Tube Daily  . insulin aspart  2-6 Units Subcutaneous 6 times per day  . niCARDipine  5 mg/hr Intravenous Once  . pantoprazole (PROTONIX) IV  40 mg Intravenous QHS   PRN:  sodium chloride, acetaminophen, fentaNYL  Diet:  NPO   Activity:  Bedrest    DVT Prophylaxis: SCDs  CLINICALLY SIGNIFICANT STUDIES Basic Metabolic Panel:  Recent Labs Lab 07/19/2014 2115  07/28/14 0359  07/29/14 0256  07/29/14 2100 07/30/14 0305  NA 140  < > 145  < > 158*  < > 157* 160*  K 3.5*  < > 4.0  < > 3.4*  --   --  3.1*  CL 101  < > 110  < > 126*  --   --  125*  CO2 22  < >  21  < > 20  --   --  22  GLUCOSE 210*  < > 132*  < > 163*  --   --  163*  BUN 16  < > 13  < > 11  --   --  24*  CREATININE 0.78  < > 0.66  < > 0.75  --   --  0.79  CALCIUM 8.9  < > 8.6  < > 8.8  --   --  9.3  MG 2.0  --  2.0  --   --   --   --   --   PHOS 1.9*  --  2.6  --   --   --   --   --   < > = values in this interval not displayed. Liver Function Tests:   Recent Labs Lab August 18, 2014 1850 08/18/2014 2115  AST 27 31  ALT 15 17  ALKPHOS 119* 116  BILITOT 0.4 1.0  PROT 7.6 7.8  ALBUMIN 4.2 4.4   CBC:  Recent Labs Lab 18-Aug-2014 1850  August 18, 2014 2115  07/29/14 0256 07/30/14 0305  WBC 9.7  --  24.4*  < > 17.4* 18.2*  NEUTROABS 5.4  --  21.0*  --   --   --   HGB 13.7  < > 14.0  < > 11.8* 11.1*  HCT 40.7  < > 41.3  <  > 35.5* 33.5*  MCV 91.1  --  91.0  < > 91.5 92.0  PLT 208  --  192  < > 155 159  < > = values in this interval not displayed. Coagulation:   Recent Labs Lab 2014-08-18 1850  LABPROT 13.8  INR 1.06   Cardiac Enzymes:   Recent Labs Lab 18-Aug-2014 2115 07/28/14 0200 07/28/14 0845  TROPONINI <0.30 <0.30 <0.30   Urinalysis: No results found for this basename: COLORURINE, APPERANCEUR, LABSPEC, PHURINE, GLUCOSEU, HGBUR, BILIRUBINUR, KETONESUR, PROTEINUR, UROBILINOGEN, NITRITE, LEUKOCYTESUR,  in the last 168 hours Lipid Panel    Component Value Date/Time   CHOL 215* 08/18/14 2115   TRIG 147 08-18-14 2115   TRIG 150* 2014-08-18 2115   HDL 50 18-Aug-2014 2115   CHOLHDL 4.3 08-18-14 2115   VLDL 30 08/18/2014 2115   LDLCALC 135* August 18, 2014 2115   HgbA1C  Lab Results  Component Value Date   HGBA1C 5.9* 08/18/14    Urine Drug Screen:     Component Value Date/Time   LABOPIA NONE DETECTED 18-Aug-2014 2306   COCAINSCRNUR NONE DETECTED 2014-08-18 2306   LABBENZ POSITIVE* 08-18-2014 2306   AMPHETMU NONE DETECTED 2014/08/18 2306   THCU NONE DETECTED Aug 18, 2014 2306   LABBARB NONE DETECTED 08/18/2014 2306    Alcohol Level: No results found for this basename: ETH,  in the last 168 hours  Mr [facility] Contrast  07/29/2014   CLINICAL DATA:  Intracranial hemorrhage.  EXAM: MRI HEAD WITHOUT CONTRAST  MRA HEAD WITHOUT CONTRAST  TECHNIQUE: Multiplanar, multiecho pulse sequences of the brain and surrounding structures were obtained without intravenous contrast. Angiographic images of the head were obtained using MRA technique without contrast.  COMPARISON:  Head CT 07/28/2014  FINDINGS: MRI HEAD FINDINGS  Large intraparenchymal hemorrhage is again seen involving the posterior right temporal, right occipital, and right parietal lobes, similar to recent CT, with blood-fluid levels present. The hemorrhage measures up to approximately 7.1 x 3.5 cm on axial images. There are a few punctate foci of acute infarction separate  from this in the right corona radiata, right  frontal operculum, and left parietal operculum. There is moderate edema surrounding the hemorrhage with partial effacement of the posterior right lateral ventricle, partial effacement of the basilar cisterns, mild mass effect on the midbrain, and 12 mm of leftward midline shift. Small right frontal subdural hematoma measures up to approximately 5 mm in thickness. A small amount of subdural hemorrhage is also present along the right aspect of the falx posteriorly. Abnormal FLAIR hyperintensity within cerebral sulci bilaterally is favored to represent artifact related to mechanical ventilation and hyperoxygenation given lack of evidence of subarachnoid hemorrhage on the prior CTs.  Patchy T2 hyperintensities L2 where in the cerebral white matter bilaterally are nonspecific but compatible with mild to moderate chronic small vessel ischemic disease. There is no frank hydrocephalus. Prior bilateral cataract extraction is noted. Paranasal sinuses are clear. There is trace left mastoid fluid. Major intracranial vascular flow voids are preserved.  MRA HEAD FINDINGS  Visualized distal vertebral arteries are patent with the right being minimally larger than the left. PICA origins are patent. AICA and SCA origins are patent. Basilar artery is patent without evidence of significant stenosis. Mildly diminished flow related enhancement and apparent mild narrowing of the proximal to mid basilar artery is favored to be artifactual due to motion. There is a focal, mild to moderate stenosis of the left PCA near the P1-P2 junction. No significant proximal right PCA stenosis is identified. Mild to moderate PCA branch vessel irregularity is present bilaterally. Posterior communicating arteries are not clearly identified.  Internal carotid arteries are patent from skullbase to carotid termini. Proximal MCAs are unremarkable. There is moderate right MCA branch vessel irregularity and  attenuation, with mild left MCA branch vessel irregularity noted. The right A1 segment is hypoplastic and mildly irregular. The region of the anterior communicating artery appears somewhat bulbous. A discrete aneurysm is not definitely identified, however evaluation is limited by motion through this region. ACAs are otherwise unremarkable aside from mild branch vessel irregularity.  IMPRESSION: 1. 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 2. Separate, punctate, acute infarcts in the right corona radiata, right frontal operculum, and left parietal operculum. 3. Small amount right-sided subdural hematoma. 4. No evidence of major intracranial arterial occlusion. Moderate right M2 branch vessel irregularity and attenuation. 5. Mild to moderate proximal left PCA stenosis. 6. Bulbous appearance of the anterior communicating artery. Evaluation limited by motion, however underlying aneurysm not excluded.   Electronically Signed   By: Sebastian Ache   On: 07/29/2014 20:33   Mr Brain Wo Contrast  07/29/2014   CLINICAL DATA:  Intracranial hemorrhage.  EXAM: MRI HEAD WITHOUT CONTRAST  MRA HEAD WITHOUT CONTRAST  TECHNIQUE: Multiplanar, multiecho pulse sequences of the brain and surrounding structures were obtained without intravenous contrast. Angiographic images of the head were obtained using MRA technique without contrast.  COMPARISON:  Head CT 07/28/2014  FINDINGS: MRI HEAD FINDINGS  Large intraparenchymal hemorrhage is again seen involving the posterior right temporal, right occipital, and right parietal lobes, similar to recent CT, with blood-fluid levels present. The hemorrhage measures up to approximately 7.1 x 3.5 cm on axial images. There are a few punctate foci of acute infarction separate from this in the right corona radiata, right frontal operculum, and left parietal operculum. There is moderate edema  surrounding the hemorrhage with partial effacement of the posterior right lateral ventricle, partial effacement of the basilar cisterns, mild mass effect on the midbrain, and 12  mm of leftward midline shift. Small right frontal subdural hematoma measures up to approximately 5 mm in thickness. A small amount of subdural hemorrhage is also present along the right aspect of the falx posteriorly. Abnormal FLAIR hyperintensity within cerebral sulci bilaterally is favored to represent artifact related to mechanical ventilation and hyperoxygenation given lack of evidence of subarachnoid hemorrhage on the prior CTs.  Patchy T2 hyperintensities L2 where in the cerebral white matter bilaterally are nonspecific but compatible with mild to moderate chronic small vessel ischemic disease. There is no frank hydrocephalus. Prior bilateral cataract extraction is noted. Paranasal sinuses are clear. There is trace left mastoid fluid. Major intracranial vascular flow voids are preserved.  MRA HEAD FINDINGS  Visualized distal vertebral arteries are patent with the right being minimally larger than the left. PICA origins are patent. AICA and SCA origins are patent. Basilar artery is patent without evidence of significant stenosis. Mildly diminished flow related enhancement and apparent mild narrowing of the proximal to mid basilar artery is favored to be artifactual due to motion. There is a focal, mild to moderate stenosis of the left PCA near the P1-P2 junction. No significant proximal right PCA stenosis is identified. Mild to moderate PCA branch vessel irregularity is present bilaterally. Posterior communicating arteries are not clearly identified.  Internal carotid arteries are patent from skullbase to carotid termini. Proximal MCAs are unremarkable. There is moderate right MCA branch vessel irregularity and attenuation, with mild left MCA branch vessel irregularity noted. The right A1 segment is hypoplastic and mildly irregular. The  region of the anterior communicating artery appears somewhat bulbous. A discrete aneurysm is not definitely identified, however evaluation is limited by motion through this region. ACAs are otherwise unremarkable aside from mild branch vessel irregularity.  IMPRESSION: 1. 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 2. Separate, punctate, acute infarcts in the right corona radiata, right frontal operculum, and left parietal operculum. 3. Small amount right-sided subdural hematoma. 4. No evidence of major intracranial arterial occlusion. Moderate right M2 branch vessel irregularity and attenuation. 5. Mild to moderate proximal left PCA stenosis. 6. Bulbous appearance of the anterior communicating artery. Evaluation limited by motion, however underlying aneurysm not excluded.   Electronically Signed   By: Sebastian AcheAllen  Grady   On: 07/29/2014 20:33   Dg Chest Port 1 View  07/29/2014   CLINICAL DATA:  Check endotracheal tube placement  EXAM: PORTABLE CHEST - 1 VIEW  COMPARISON:  08/06/2014  FINDINGS: An endotracheal tube is noted 4.8 cm above the carina. A nasogastric catheter is seen coiled within the stomach. A left central venous line is again noted and stable. Cardiac shadow is stable. The lungs are well-aerated without evidence of focal infiltrate or sizable effusion.  IMPRESSION: Tubes and lines as described above.  No acute abnormality is seen.   Electronically Signed   By: Alcide CleverMark  Lukens M.D.   On: 07/29/2014 07:46   Dg Abd Portable 1v  07/29/2014   CLINICAL DATA:  Orogastric tube position.  EXAM: PORTABLE ABDOMEN - 1 VIEW  COMPARISON:  None.  FINDINGS: Orogastric tube is present with the tip in the mid gastric fundus. Redundant loop is present near the cardia of the stomach. Monitoring leads project over the chest and abdomen. Bowel gas pattern appears within normal limits.  IMPRESSION: Orogastric tube tip  in the mid gastric fundus.   Electronically Signed   By: Charolette ChildGeoffrey  Lamke M.D.  On: 07/29/2014 10:11    MRI of the brain  ordered  MRA of the brain  ordered  Carotid Doppler 1-39% bilateral stenosis  2D Echocardiogram  Left ventricle: The cavity size was normal. Wall thickness was normal. Systolic function was normal. The estimated ejection fraction was in the range of 55% to 60%. Wall motion was normal; there were no regional wall motion abnormalities. Doppler parameters are consistent with abnormal left ventricular relaxation (grade 1 diastolic dysfunction).   CXR  No radiographic evidence of acute cardiopulmonary disease.      EKG    Sinus tachycardia Nonspecific ST abnormality  Therapy Recommendations pending Physical Exam  Frail elderly caucasian lady not in distress..Intubated. Afebrile. Head is nontraumatic. Neck is supple without bruit.   Cardiac exam no murmur or gallop. Lungs are clear to auscultation. Distal pulses are well felt. Neurological Exam ; examined off Propofol. Stuporose.. Not following any commands.Pupils are 3 mm reactive. Corneal reflexes are present. Dolls eye movements are sluggish. Fundi were not visualized. No obvious facial weakness. Tongue midline. Moves right side spontaneously and semi-purposefully against gravity. Mild left lower  And upper extremity movements to painful stimuli. No decerebrate posturing .Tone is decreased on the left. Left plantar is upgoing right is downgoing. ASSESSMENT Rachael Rivas is a 73 y.o. female presenting with sudden onset of mental status changes with nausea vomiting. Imaging confirms a right parieto-occipital large intracerebral hematoma with surrounding cytotoxic edema and 17mm right-to-left midline shift and cerebral herniation. There is also associated tentorial subdural and right convexity subdural hematoma. Etiology of the hemorrhage is indeterminate differential includes hypertensive  (given adm BP 200/110) versus  amyloid angiopathy. Hemorrhagic infarct or metastasis would be less .    On no antiplatelets/anticoagulants prior to admission. And there is no h/o of trauma or fall. Patient with resultant comatose state with left hemiparesis  .   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 Not on statins prior to admission  Respiratory failure secondary to cerebral edema from large brain hemorrhage  Induced Hypernatremia to control cerebral edema   Hospital day # 3  TREATMENT/PLAN  Continue ICU level care with aggressive blood pressure control and keep SBP below   180/100.  Keep normothermic, normoglycemic and euvolemic. Hold hypertonic saline as serum sodium above goal   Check repeat CT scan of the brain in am DVT and GI prophylaxis. Continue tube feeds for nutrition. Keep I =O DC SCDs and start Lovenox for DVT prophylaxis Prognosis is guarded. Long discussion with husband and brother-in-law regarding her neurological condition, prognosis and answered questions. She will likely need tracheostomy and PEG tube as I doubtt she will be able to wake up and protect her airway or the next few days.   Discussed with Dr.Feinstein-PCCM This patient is critically ill and at significant risk of neurological worsening, death and care requires constant monitoring of vital signs, hemodynamics,respiratory and cardiac monitoring,review of multiple databases, neurological assessment, discussion with family, other specialists and medical decision making of high complexity.I have made any additions or clarifications directly to the above note.  I spent 32 minutes of neurocritical care time  in the care of  this patient. Delia Heady, MD      To contact Stroke Continuity provider, please refer to [email or web address]. After hours, contact General Neurology

## 2014-07-30 NOTE — Progress Notes (Signed)
PULMONARY / CRITICAL CARE MEDICINE   Name: Rachael Rivas MRN: [id number] DOB: 1941-11-22    ADMISSION DATE:  08/16/2014  CHIEF COMPLAINT:  Headache  INITIAL PRESENTATION: 74 ICH, vent  STUDIES:  CT head 8/9:R Parietal ICH Carotid Duplex 8/10 Bilateral: 1-39% ICA stenosis. Vertebral artery flow is antegrade.  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 Separate, punctate, acute infarcts in the right corona radiata,<BR>right frontal operculum, and left parietal operculum.<BR>3. Small amount right-sided subdural hematoma  SIGNIFICANT EVENTS: 8/9 intubated 8/9 CVL insertion, 3% started 8/10 Neuro met with family agreed on DNR status 8/11 3% started 8/12 no progress  SUBJECTIVE:  Na at  goal  VITAL SIGNS: Temp:  [99 F (37.2 C)-100.2 F (37.9 C)] 99.7 F (37.6 C) (08/12 0800) Pulse Rate:  [84-120] 120 (08/12 0800) Resp:  [19-26] 23 (08/12 0800) BP: (101-153)/(49-105) 153/68 mmHg (08/12 0800) SpO2:  [98 %-100 %] 99 % (08/12 0800) FiO2 (%):  [30 %] 30 % (08/12 0800) Weight:  [64.5 kg (142 lb 3.2 oz)] 64.5 kg (142 lb 3.2 oz) (08/12 0500) HEMODYNAMICS:   VENTILATOR SETTINGS: Vent Mode:  [-] PRVC FiO2 (%):  [30 %] 30 % Set Rate:  [24 bmp] 24 bmp Vt Set:  [460 mL] 460 mL PEEP:  [5 cmH20] 5 cmH20 Plateau Pressure:  [14 cmH20-16 cmH20] 15 cmH20 INTAKE / OUTPUT:  Intake/Output Summary (Last 24 hours) at 07/30/14 0914 Last data filed at 07/30/14 0800  Gross per 24 hour  Intake 1663.31 ml  Output   1610 ml  Net  53.31 ml    PHYSICAL EXAMINATION: General:  Intubated, sedated, does not follow commands Neuro: rass -2, WD pain, localizes HEENT:  jvd wnl Cardiovascular:  RRR, no loud murmur Lungs: coarse mild Abdomen:  Soft, nontender, no guarding, no bs noted Musculoskeletal:  no edema Skin:  No ecchymosis, warm and  dry  LABS:  CBC  Recent Labs Lab 07/28/14 0359 07/29/14 0256 07/30/14 0305  WBC 14.8* 17.4* 18.2*  HGB 12.1 11.8* 11.1*  HCT 36.9 35.5* 33.5*  PLT 163 155 159   Coag's  Recent Labs Lab 08/17/2014 1850  APTT 24  INR 1.06   BMET  Recent Labs Lab 07/28/14 0845  07/29/14 0256  07/29/14 1400 07/29/14 2100 07/30/14 0305  NA 148*  < > 158*  < > 160* 157* 160*  K 3.6*  --  3.4*  --   --   --  3.1*  CL 114*  --  126*  --   --   --  125*  CO2 22  --  20  --   --   --  22  BUN 11  --  11  --   --   --  24*  CREATININE 0.69  --  0.75  --   --   --  0.79  GLUCOSE 144*  --  163*  --   --   --  163*  < > = values in this interval not displayed. Electrolytes  Recent Labs Lab 08/07/2014 2115  07/28/14 0359  07/28/14 0845 07/29/14 0256 07/30/14 0305  CALCIUM 8.9  < > 8.6  < > 8.3* 8.8 9.3  MG 2.0  --  2.0  --   --   --   --   PHOS 1.9*  --  2.6  --   --   --   --   < > = values  in this interval not displayed. Sepsis Markers  Recent Labs Lab 07/19/2014 2115  LATICACIDVEN 2.5*   ABG  Recent Labs Lab 08/16/2014 2258 07/28/14 0542 07/29/14 0500  PHART 7.450 7.440 7.408  PCO2ART 32.9* 31.7* 31.3*  PO2ART 91.1 93.9 118.0*   Liver Enzymes  Recent Labs Lab 08/06/2014 1850 08/10/2014 2115  AST 27 31  ALT 15 17  ALKPHOS 119* 116  BILITOT 0.4 1.0  ALBUMIN 4.2 4.4   Cardiac Enzymes  Recent Labs Lab 07/29/2014 2115 07/28/14 0200 07/28/14 0845  TROPONINI <0.30 <0.30 <0.30   Glucose  Recent Labs Lab 07/29/14 1142 07/29/14 1604 07/29/14 1934 07/29/14 2341 07/30/14 0359 07/30/14 0737  GLUCAP 136* 165* 125* 150* 174* 133*    Imaging Mr Mra Head Wo Contrast  07/29/2014   CLINICAL DATA:  Intracranial hemorrhage.  EXAM: MRI HEAD WITHOUT CONTRAST  MRA HEAD WITHOUT CONTRAST  TECHNIQUE: Multiplanar, multiecho pulse sequences of the brain and surrounding structures were obtained without intravenous contrast. Angiographic images of the head were obtained using MRA  technique without contrast.  COMPARISON:  Head CT 07/28/2014  FINDINGS: MRI HEAD FINDINGS  Large intraparenchymal hemorrhage is again seen involving the posterior right temporal, right occipital, and right parietal lobes, similar to recent CT, with blood-fluid levels present. The hemorrhage measures up to approximately 7.1 x 3.5 cm on axial images. There are a few punctate foci of acute infarction separate from this in the right corona radiata, right frontal operculum, and left parietal operculum. There is moderate edema surrounding the hemorrhage with partial effacement of the posterior right lateral ventricle, partial effacement of the basilar cisterns, mild mass effect on the midbrain, and 12 mm of leftward midline shift. Small right frontal subdural hematoma measures up to approximately 5 mm in thickness. A small amount of subdural hemorrhage is also present along the right aspect of the falx posteriorly. Abnormal FLAIR hyperintensity within cerebral sulci bilaterally is favored to represent artifact related to mechanical ventilation and hyperoxygenation given lack of evidence of subarachnoid hemorrhage on the prior CTs.  Patchy T2 hyperintensities L2 where in the cerebral white matter bilaterally are nonspecific but compatible with mild to moderate chronic small vessel ischemic disease. There is no frank hydrocephalus. Prior bilateral cataract extraction is noted. Paranasal sinuses are clear. There is trace left mastoid fluid. Major intracranial vascular flow voids are preserved.  MRA HEAD FINDINGS  Visualized distal vertebral arteries are patent with the right being minimally larger than the left. PICA origins are patent. AICA and SCA origins are patent. Basilar artery is patent without evidence of significant stenosis. Mildly diminished flow related enhancement and apparent mild narrowing of the proximal to mid basilar artery is favored to be artifactual due to motion. There is a focal, mild to moderate  stenosis of the left PCA near the P1-P2 junction. No significant proximal right PCA stenosis is identified. Mild to moderate PCA branch vessel irregularity is present bilaterally. Posterior communicating arteries are not clearly identified.  Internal carotid arteries are patent from skullbase to carotid termini. Proximal MCAs are unremarkable. There is moderate right MCA branch vessel irregularity and attenuation, with mild left MCA branch vessel irregularity noted. The right A1 segment is hypoplastic and mildly irregular. The region of the anterior communicating artery appears somewhat bulbous. A discrete aneurysm is not definitely identified, however evaluation is limited by motion through this region. ACAs are otherwise unremarkable aside from mild branch vessel irregularity.  IMPRESSION: 1. 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 2. Separate, punctate, acute infarcts in the right corona radiata, right frontal operculum, and left parietal operculum. 3. Small amount right-sided subdural hematoma. 4. No evidence of major intracranial arterial occlusion. Moderate right M2 branch vessel irregularity and attenuation. 5. Mild to moderate proximal left PCA stenosis. 6. Bulbous appearance of the anterior communicating artery. Evaluation limited by motion, however underlying aneurysm not excluded.   Electronically Signed   By: Logan Bores   On: 07/29/2014 20:33   Mr Brain Wo Contrast  07/29/2014   CLINICAL DATA:  Intracranial hemorrhage.  EXAM: MRI HEAD WITHOUT CONTRAST  MRA HEAD WITHOUT CONTRAST  TECHNIQUE: Multiplanar, multiecho pulse sequences of the brain and surrounding structures were obtained without intravenous contrast. Angiographic images of the head were obtained using MRA technique without contrast.  COMPARISON:  Head CT 07/28/2014  FINDINGS: MRI HEAD FINDINGS  Large intraparenchymal hemorrhage  is again seen involving the posterior right temporal, right occipital, and right parietal lobes, similar to recent CT, with blood-fluid levels present. The hemorrhage measures up to approximately 7.1 x 3.5 cm on axial images. There are a few punctate foci of acute infarction separate from this in the right corona radiata, right frontal operculum, and left parietal operculum. There is moderate edema surrounding the hemorrhage with partial effacement of the posterior right lateral ventricle, partial effacement of the basilar cisterns, mild mass effect on the midbrain, and 12 mm of leftward midline shift. Small right frontal subdural hematoma measures up to approximately 5 mm in thickness. A small amount of subdural hemorrhage is also present along the right aspect of the falx posteriorly. Abnormal FLAIR hyperintensity within cerebral sulci bilaterally is favored to represent artifact related to mechanical ventilation and hyperoxygenation given lack of evidence of subarachnoid hemorrhage on the prior CTs.  Patchy T2 hyperintensities L2 where in the cerebral white matter bilaterally are nonspecific but compatible with mild to moderate chronic small vessel ischemic disease. There is no frank hydrocephalus. Prior bilateral cataract extraction is noted. Paranasal sinuses are clear. There is trace left mastoid fluid. Major intracranial vascular flow voids are preserved.  MRA HEAD FINDINGS  Visualized distal vertebral arteries are patent with the right being minimally larger than the left. PICA origins are patent. AICA and SCA origins are patent. Basilar artery is patent without evidence of significant stenosis. Mildly diminished flow related enhancement and apparent mild narrowing of the proximal to mid basilar artery is favored to be artifactual due to motion. There is a focal, mild to moderate stenosis of the left PCA near the P1-P2 junction. No significant proximal right PCA stenosis is identified. Mild to moderate PCA  branch vessel irregularity is present bilaterally. Posterior communicating arteries are not clearly identified.  Internal carotid arteries are patent from skullbase to carotid termini. Proximal MCAs are unremarkable. There is moderate right MCA branch vessel irregularity and attenuation, with mild left MCA branch vessel irregularity noted. The right A1 segment is hypoplastic and mildly irregular. The region of the anterior communicating artery appears somewhat bulbous. A discrete aneurysm is not definitely identified, however evaluation is limited by motion through this region. ACAs are otherwise unremarkable aside from mild branch vessel irregularity.  IMPRESSION: 1. 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 2. Separate, punctate, acute infarcts in the right corona radiata, right frontal operculum, and left parietal operculum. 3. Small amount right-sided subdural hematoma. 4. No evidence  of major intracranial arterial occlusion. Moderate right M2 branch vessel irregularity and attenuation. 5. Mild to moderate proximal left PCA stenosis. 6. Bulbous appearance of the anterior communicating artery. Evaluation limited by motion, however underlying aneurysm not excluded.   Electronically Signed   By: Logan Bores   On: 07/29/2014 20:33   Dg Chest Port 1 View  07/29/2014   CLINICAL DATA:  Check endotracheal tube placement  EXAM: PORTABLE CHEST - 1 VIEW  COMPARISON:  08/07/2014  FINDINGS: An endotracheal tube is noted 4.8 cm above the carina. A nasogastric catheter is seen coiled within the stomach. A left central venous line is again noted and stable. Cardiac shadow is stable. The lungs are well-aerated without evidence of focal infiltrate or sizable effusion.  IMPRESSION: Tubes and lines as described above.  No acute abnormality is seen.   Electronically Signed   By: Inez Catalina M.D.   On: 07/29/2014 07:46    Dg Abd Portable 1v  07/29/2014   CLINICAL DATA:  Orogastric tube position.  EXAM: PORTABLE ABDOMEN - 1 VIEW  COMPARISON:  None.  FINDINGS: Orogastric tube is present with the tip in the mid gastric fundus. Redundant loop is present near the cardia of the stomach. Monitoring leads project over the chest and abdomen. Bowel gas pattern appears within normal limits.  IMPRESSION: Orogastric tube tip in the mid gastric fundus.   Electronically Signed   By: Dereck Ligas M.D.   On: 07/29/2014 10:11   CT head: 1. 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 There is a large amount of surrounding edema and associated mass effect, with extensive right to left midline shift and evidence suggesting early transtentorial uncal herniation, as above   ASSESSMENT / PLAN:  PULMONARY OETT 8/9 A: VDRF due to altered mental status due to [place] P:   No role resp alk, avoid acidosis however abg last reviewed, keep same MV VAP bundle Consider sbt short 30 min, no extubation planned, consider PS 10 , avoid acidosis pcxr in am for atx  CARDIOVASCULAR CVL 8/9 LIJ>> A: HTN P: Restart nicardipine if out of BP range goals cvp assessment  RENAL A:  Induced hypernatremia, hypokalemia P:   3% per neuro Chem evaluation No free water k supp  GASTROINTESTINAL A:  No acute issue P:   Tf to goal ppi  HEMATOLOGIC A:  dvt prevention P:  Monitor cbc in am  scd  INFECTIOUS A:  No acute issue P:   Cont with monitor pcxr in am   ENDOCRINE A:  No acute issue P:   Monitor blood sugar, goal 140-180  NEUROLOGIC A:  Large R ICH likely hypertensive bleed, etiology unclear P:   BP control with cardene gtt, goal 140 3% saline with goal Na 150-155, Q6 Na Adequate sedation, RASS goal: -2 HOB elevated  TODAY'S SUMMARY: Na goals met, attempt wean sbt, supp k  CC time  - 7mns  Ardean Simonich J. FTitus Mould MD, FPloverPgr: [id number]

## 2014-07-31 ENCOUNTER — Inpatient Hospital Stay (HOSPITAL_COMMUNITY): Payer: Medicare Other

## 2014-07-31 LAB — SODIUM
SODIUM: 160 meq/L — AB (ref 137–147)
Sodium: 159 mEq/L — ABNORMAL HIGH (ref 137–147)
Sodium: 161 mEq/L — ABNORMAL HIGH (ref 137–147)

## 2014-07-31 LAB — BASIC METABOLIC PANEL
Anion gap: 11 (ref 5–15)
BUN: 29 mg/dL — ABNORMAL HIGH (ref 6–23)
CHLORIDE: 124 meq/L — AB (ref 96–112)
CO2: 25 mEq/L (ref 19–32)
Calcium: 8.9 mg/dL (ref 8.4–10.5)
Creatinine, Ser: 0.7 mg/dL (ref 0.50–1.10)
GFR calc Af Amer: >90 mL/min (ref >90)
GFR, EST NON AFRICAN AMERICAN: 83 mL/min — AB (ref >90)
Glucose, Bld: 156 mg/dL — ABNORMAL HIGH (ref 70–99)
POTASSIUM: 3.2 meq/L — AB (ref 3.7–5.3)
SODIUM: 160 meq/L — AB (ref 137–147)

## 2014-07-31 LAB — GLUCOSE, CAPILLARY
GLUCOSE-CAPILLARY: 136 mg/dL — AB (ref 70–99)
GLUCOSE-CAPILLARY: 156 mg/dL — AB (ref 70–99)
Glucose-Capillary: 133 mg/dL — ABNORMAL HIGH (ref 70–99)
Glucose-Capillary: 144 mg/dL — ABNORMAL HIGH (ref 70–99)
Glucose-Capillary: 148 mg/dL — ABNORMAL HIGH (ref 70–99)
Glucose-Capillary: 155 mg/dL — ABNORMAL HIGH (ref 70–99)
Glucose-Capillary: 156 mg/dL — ABNORMAL HIGH (ref 70–99)

## 2014-07-31 LAB — CBC WITH DIFFERENTIAL/PLATELET
Basophils Absolute: 0 10*3/uL (ref 0.0–0.1)
Basophils Relative: 0 % (ref 0–1)
EOS PCT: 0 % (ref 0–5)
Eosinophils Absolute: 0 10*3/uL (ref 0.0–0.7)
HCT: 33.2 % — ABNORMAL LOW (ref 36.0–46.0)
HEMOGLOBIN: 10.8 g/dL — AB (ref 12.0–15.0)
LYMPHS ABS: 1.2 10*3/uL (ref 0.7–4.0)
LYMPHS PCT: 10 % — AB (ref 12–46)
MCH: 30.1 pg (ref 26.0–34.0)
MCHC: 32.5 g/dL (ref 30.0–36.0)
MCV: 92.5 fL (ref 78.0–100.0)
Monocytes Absolute: 0.8 10*3/uL (ref 0.1–1.0)
Monocytes Relative: 6 % (ref 3–12)
Neutro Abs: 10.1 10*3/uL — ABNORMAL HIGH (ref 1.7–7.7)
Neutrophils Relative %: 84 % — ABNORMAL HIGH (ref 43–77)
PLATELETS: 148 10*3/uL — AB (ref 150–400)
RBC: 3.59 MIL/uL — AB (ref 3.87–5.11)
RDW: 14.4 % (ref 11.5–15.5)
WBC: 12.1 10*3/uL — AB (ref 4.0–10.5)

## 2014-07-31 LAB — MAGNESIUM: Magnesium: 2.2 mg/dL (ref 1.5–2.5)

## 2014-07-31 LAB — PHOSPHORUS: Phosphorus: 2.9 mg/dL (ref 2.3–4.6)

## 2014-07-31 MED ORDER — CETYLPYRIDINIUM CHLORIDE 0.05 % MT LIQD
7.0000 mL | Freq: Four times a day (QID) | OROMUCOSAL | Status: DC
  Administered 2014-07-31 – 2014-08-03 (×11): 7 mL via OROMUCOSAL

## 2014-07-31 MED ORDER — ETOMIDATE 2 MG/ML IV SOLN
40.0000 mg | Freq: Once | INTRAVENOUS | Status: DC
  Filled 2014-07-31 (×2): qty 20

## 2014-07-31 MED ORDER — VECURONIUM BROMIDE 10 MG IV SOLR
10.0000 mg | Freq: Once | INTRAVENOUS | Status: DC
  Filled 2014-07-31 (×2): qty 10

## 2014-07-31 MED ORDER — FENTANYL CITRATE 0.05 MG/ML IJ SOLN
200.0000 ug | Freq: Once | INTRAMUSCULAR | Status: DC
  Filled 2014-07-31: qty 4

## 2014-07-31 MED ORDER — ACETAMINOPHEN 325 MG PO TABS
650.0000 mg | ORAL_TABLET | ORAL | Status: DC | PRN
  Administered 2014-07-31 – 2014-08-01 (×4): 650 mg via ORAL
  Filled 2014-07-31 (×4): qty 2

## 2014-07-31 MED ORDER — POTASSIUM CHLORIDE 20 MEQ/15ML (10%) PO LIQD
30.0000 meq | ORAL | Status: AC
  Administered 2014-07-31 (×2): 30 meq
  Filled 2014-07-31 (×2): qty 30

## 2014-07-31 MED ORDER — MIDAZOLAM HCL 2 MG/2ML IJ SOLN
4.0000 mg | Freq: Once | INTRAMUSCULAR | Status: DC
  Filled 2014-07-31: qty 4

## 2014-07-31 MED ORDER — PROPOFOL 10 MG/ML IV EMUL
5.0000 ug/kg/min | Freq: Once | INTRAVENOUS | Status: DC
  Filled 2014-07-31: qty 100

## 2014-07-31 NOTE — Progress Notes (Signed)
NUTRITION FOLLOW-UP  INTERVENTION: Jevity 1.2 @ 45 ml/hr via OGT   30 ml Prostat once daily.    Tube feeding regimen provides 1080 ml, 1396 kcal, 75 grams of protein, and 875 ml of H2O.  TF regimen and propofol at current rate providing 1681 total kcal/day (100 % of kcal needs)  NUTRITION DIAGNOSIS: Inadequate oral intake related to inability to eat as evidenced by NPO/Vent status; ongoing.    Goal: Pt to meet >/= 90% of their estimated nutrition needs, met.   Monitor:  Vent status, TF initiation/tolerance, weight trend, labs  ASSESSMENT: 73-year-old female with no significant past medical history. CT head showed large acute hemorrhagic infarction. She was intubated for airway protection.  OG tube in place.  Plan for trach 8/14 and PEG 8/17.   Patient is currently intubated on ventilator support MV: 11 L/min Temp (24hrs), Avg:100.1 F (37.8 C), Min:99 F (37.2 C), Max:101.5 F (38.6 C) +fevers Propofol: 10.8 ml/hr (provides 285 kcal per 24 hours)  Potassium low. CBG's: 133-156  Height: Ht Readings from Last 1 Encounters:  07/31/2014 5' 7" (1.702 m)    Weight: Wt Readings from Last 1 Encounters:  07/31/14 132 lb 4.4 oz (60 kg)  Admission weight: 143 lb (65.1 kg) 8/9 Usual weight: 137 lb (62.2 kg)  BMI:  Body mass index is 20.71 kg/(m^2).  Estimated Nutritional Needs: Kcal: 1675 Protein: 80-90 grams Fluid: 1.7 L/day  Skin: intact  Diet Order:     Intake/Output Summary (Last 24 hours) at 07/31/14 1515 Last data filed at 07/31/14 1500  Gross per 24 hour  Intake 1460.45 ml  Output   2384 ml  Net -923.55 ml    Last BM: PTA  Labs:   Recent Labs Lab 08/18/2014 2115  07/28/14 0359  07/29/14 0256  07/30/14 0305  07/30/14 2055 07/31/14 0310 07/31/14 0844  NA 140  < > 145  < > 158*  < > 160*  < > 160* 160* 160*  K 3.5*  < > 4.0  < > 3.4*  --  3.1*  --   --  3.2*  --   CL 101  < > 110  < > 126*  --  125*  --   --  124*  --   CO2 22  < > 21  < > 20   --  22  --   --  25  --   BUN 16  < > 13  < > 11  --  24*  --   --  29*  --   CREATININE 0.78  < > 0.66  < > 0.75  --  0.79  --   --  0.70  --   CALCIUM 8.9  < > 8.6  < > 8.8  --  9.3  --   --  8.9  --   MG 2.0  --  2.0  --   --   --   --   --   --  2.2  --   PHOS 1.9*  --  2.6  --   --   --   --   --   --  2.9  --   GLUCOSE 210*  < > 132*  < > 163*  --  163*  --   --  156*  --   < > = values in this interval not displayed.  CBG (last 3)   Recent Labs  07/31/14 0305 07/31/14 0728 07/31/14 1244  GLUCAP 155* 133*   156*    Scheduled Meds: . antiseptic oral rinse  7 mL Mouth Rinse QID  . chlorhexidine  15 mL Mouth Rinse BID  . enoxaparin (LOVENOX) injection  40 mg Subcutaneous Q24H  . etomidate  40 mg Intravenous Once  . feeding supplement (PRO-STAT SUGAR FREE 64)  30 mL Per Tube Daily  . fentaNYL  200 mcg Intravenous Once  . insulin aspart  2-6 Units Subcutaneous 6 times per day  . midazolam  4 mg Intravenous Once  . pantoprazole (PROTONIX) IV  40 mg Intravenous QHS  . propofol  5-70 mcg/kg/min Intravenous Once  . vecuronium  10 mg Intravenous Once    Continuous Infusions: . feeding supplement (JEVITY 1.2 CAL) 1,000 mL (07/31/14 1500)  . niCARDipine Stopped (07/30/14 1137)  . propofol 25 mcg/kg/min (07/31/14 0943)  . sodium chloride (hypertonic) Stopped (07/29/14 0315)     [place], [place], [place] Pager [phone] After Hours Pager

## 2014-07-31 NOTE — Progress Notes (Signed)
Was asked to place PEG tube by Dr. Pearlean BrownieSethi. Spoke with husband who is in agreement with placement. Will schedule for Monday morning.    Freeman CaldronMichael J. Forestine Macho, PA-C Pager: [phone] General Trauma PA Pager: [phone]

## 2014-07-31 NOTE — Progress Notes (Signed)
PULMONARY / CRITICAL CARE MEDICINE   Name: Rachael Rivas MRN: [id number] DOB: 01/04/1941    ADMISSION DATE:  08/02/2014  CHIEF COMPLAINT:  Headache  INITIAL PRESENTATION: 73 ICH, vent  STUDIES:  CT head 8/9:R Parietal ICH Carotid Duplex 8/10 Bilateral: 1-39% ICA stenosis. Vertebral artery flow is antegrade.  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 Separate, punctate, acute infarcts in the right corona radiata,<BR>right frontal operculum, and left parietal operculum.Small amount right-sided subdural hematoma  SIGNIFICANT EVENTS: 8/9 intubated 8/9 CVL insertion, 3% started 8/10 Neuro met with family agreed on DNR status 8/11 3% started 8/12 no progress  SUBJECTIVE:  Weaned 30 min , now this am apnic  VITAL SIGNS: Temp:  [99 F (37.2 C)-100.8 F (38.2 C)] 99.9 F (37.7 C) (08/13 0800) Pulse Rate:  [72-120] 104 (08/13 0800) Resp:  [18-26] 19 (08/13 0800) BP: (104-163)/(52-87) 163/87 mmHg (08/13 0800) SpO2:  [91 %-100 %] 100 % (08/13 0800) FiO2 (%):  [30 %] 30 % (08/13 0810) Weight:  [60 kg (132 lb 4.4 oz)] 60 kg (132 lb 4.4 oz) (08/13 0500) HEMODYNAMICS: CVP:  [2 mmHg-29 mmHg] 10 mmHg VENTILATOR SETTINGS: Vent Mode:  [-] PRVC FiO2 (%):  [30 %] 30 % Set Rate:  [24 bmp] 24 bmp Vt Set:  [460 mL] 460 mL PEEP:  [5 cmH20] 5 cmH20 Pressure Support:  [10 cmH20] 10 cmH20 Plateau Pressure:  [14 cmH20-17 cmH20] 15 cmH20 INTAKE / OUTPUT:  Intake/Output Summary (Last 24 hours) at 07/31/14 0918 Last data filed at 07/31/14 0800  Gross per 24 hour  Intake 1368.49 ml  Output   1912 ml  Net -543.51 ml    PHYSICAL EXAMINATION: General:  Intubated, sedated, does not follow commands Neuro: rass -1, WD pain HEENT:  jvd slight increased Cardiovascular:  RRR, no loud murmur Lungs: coarse bilat Abdomen:  Soft, nontender, no guarding, no bs  wnl Musculoskeletal:  no edema Skin:  No ecchymosis, warm and dry  LABS:  CBC  Recent Labs Lab 07/29/14 0256 07/30/14 0305 07/31/14 0310  WBC 17.4* 18.2* 12.1*  HGB 11.8* 11.1* 10.8*  HCT 35.5* 33.5* 33.2*  PLT 155 159 148*   Coag's  Recent Labs Lab 08/17/2014 1850  APTT 24  INR 1.06   BMET  Recent Labs Lab 07/29/14 0256  07/30/14 0305  07/30/14 1500 07/30/14 2055 07/31/14 0310  NA 158*  < > 160*  < > 160* 160* 160*  K 3.4*  --  3.1*  --   --   --  3.2*  CL 126*  --  125*  --   --   --  124*  CO2 20  --  22  --   --   --  25  BUN 11  --  24*  --   --   --  29*  CREATININE 0.75  --  0.79  --   --   --  0.70  GLUCOSE 163*  --  163*  --   --   --  156*  < > = values in this interval not displayed. Electrolytes  Recent Labs Lab 08/06/2014 2115  07/28/14 0359  07/29/14 0256 07/30/14 0305 07/31/14 0310  CALCIUM 8.9  < > 8.6  < > 8.8 9.3 8.9  MG 2.0  --  2.0  --   --   --  2.2  PHOS 1.9*  --  2.6  --   --   --  2.9  < > = values in this interval not displayed. Sepsis Markers  Recent Labs Lab 08/01/2014 2115  LATICACIDVEN 2.5*   ABG  Recent Labs Lab 08/02/2014 2258 07/28/14 0542 07/29/14 0500  PHART 7.450 7.440 7.408  PCO2ART 32.9* 31.7* 31.3*  PO2ART 91.1 93.9 118.0*   Liver Enzymes  Recent Labs Lab 08/02/2014 1850 08/10/2014 2115  AST 27 31  ALT 15 17  ALKPHOS 119* 116  BILITOT 0.4 1.0  ALBUMIN 4.2 4.4   Cardiac Enzymes  Recent Labs Lab 07/30/2014 2115 07/28/14 0200 07/28/14 0845  TROPONINI <0.30 <0.30 <0.30   Glucose  Recent Labs Lab 07/30/14 1126 07/30/14 1610 07/30/14 1951 07/31/14 0034 07/31/14 0305 07/31/14 0728  GLUCAP 174* 145* 156* 136* 155* 133*    Imaging No results found. CT head: 1. 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 There is a large amount of surrounding edema and  associated mass effect, with extensive right to left midline shift and evidence suggesting early transtentorial uncal herniation, as above   ASSESSMENT / PLAN:  PULMONARY OETT 8/9 A: VDRF due to altered mental status due to [place] P:   Consider sbt repeat - apnic today Did wean 30 min 8/12 Clinically not improved - for trach likely in am  Not alkalotic on last abg as far as apnea is concerned Consider re attempt sbt ps 10, assess sens  CARDIOVASCULAR CVL 8/9 LIJ>> A: HTN P: Tele cvp 11 noted, kvo  RENAL A:  Induced hypernatremia, hypokalemia P:   Chem evaluation No free water Kvo, cvp 11 k supp  GASTROINTESTINAL A:  No acute issue P:   Tf to goal , npo 5 am ppi  HEMATOLOGIC A:  dvt prevention P:  Monitor cbc in am  scd Repeat coags for trach likley required  INFECTIOUS A:  No acute issue P:   Cont with monitor pcxr in am   ENDOCRINE A:  No acute issue P:   Monitor blood sugar, goal 140-180  NEUROLOGIC A:  Large R ICH likely hypertensive bleed, etiology unclear P:   BP controlled currently Allow na 160, greater 155 Adequate sedation, RASS goal: 0 if able HOB elevated  TODAY'S SUMMARY: wean attempts, k supp, likley trach in am   CC time - 30 mins  Lavon Paganini. Titus Mould, MD, [place] Pgr: [place] [facility]

## 2014-07-31 NOTE — Progress Notes (Signed)
[facility] FOR [facility] LAB REPLACEMENT ONLY  The patient does apply for the [facility] Electrolyte [facility] Protocol based on the criteria listed below:   1. Is GFR >/= 40 ml/min? Yes.    Patient's GFR today is 83 2. Is urine output >/= 0.5 ml/kg/hr for the last 6 hours? Yes.   Patient's UOP is 1.4 ml/kg/hr 3. Is BUN < 60 mg/dL? Yes.    Patient's BUN today is 29 4. Abnormal electrolyte(s): K3.2 5. Ordered repletion with: [id number]/tube 6. If a panic level lab has been reported, has the CCM MD in charge been notified? Yes.  .   Physician: Orlean BradfordK, Zubelevitskiy MD  Rachael Rivas, Rachael Rivas 07/31/2014 5:49 AM

## 2014-07-31 NOTE — Progress Notes (Signed)
Stroke Team Progress Note  HISTORY Rachael Rivas is an 73 y.o. female with no known medical history who was taken to the emergency room at [facility] following acute onset headache and nausea and vomiting as well as left side weakness at 6 PM today. CT scan of the head showed large acute hemorrhagic infarction centered in the right parietal and occipital regions with associated subdural extension along the tentorium cerebelli and falx cerebri. Surrounding edema and mass effect were noted with a 10 mm right to left midline shift and early signs of trans-tentorial uncal herniation. Patient showed signs of rapid deterioration including increase in pupillary size of the right and reduced responsiveness as well as increased weakness of left extremities. Blood pressure was markedly elevated at 200/110. She was intubated and placed on mechanical ventilation as well as started on Cardene drip prior to transfer to [facility]. Neurosurgery was consulted and recommended no surgical intervention.  LSN: 6 PM on August 17, 2014  tPA Given: No: Acute ICH     She was admitted to the neuro ICU for further evaluation and treatment.  SUBJECTIVE She remains neurologically stable. She gets agitated when propofol is off. She has semipurposeful movements on the right and minimum were drawn on the left. Serum sodium remains elevated despite hypertonic and off for more than 2 days. Her blood pressure is adequately controlled and temperature is normal. Repeat CT scan of the head this morning shows stable appearance with persistent 11 mm right to left midline shift. I have spoken to the husband and is agreeable with a tracheostomy tomorrow and PEG tube next week.  OBJECTIVE Most recent Vital Signs: Filed Vitals:   07/31/14 0500 07/31/14 0600 07/31/14 0700 07/31/14 0800  BP: 125/72 163/84 104/60 163/87  Pulse: 72 100 79 104  Temp: 99.9 F (37.7 C) 99 F (37.2 C) 99.7 F (37.6 C) 99.9 F (37.7 C)  TempSrc:      Resp: 24 24 24 19   Height:       Weight: 60 kg (132 lb 4.4 oz)     SpO2: 100% 99% 99% 100%   CBG (last 3)   Recent Labs  07/31/14 0034 07/31/14 0305 07/31/14 0728  GLUCAP 136* 155* 133*    IV Fluid Intake:   . feeding supplement (JEVITY 1.2 CAL) 1,000 mL (07/31/14 0800)  . niCARDipine Stopped (07/30/14 1137)  . propofol 20 mcg/kg/min (07/31/14 0800)  . sodium chloride (hypertonic) Stopped (07/29/14 0315)    MEDICATIONS  . antiseptic oral rinse  7 mL Mouth Rinse QID  . chlorhexidine  15 mL Mouth Rinse BID  . enoxaparin (LOVENOX) injection  40 mg Subcutaneous Q24H  . feeding supplement (PRO-STAT SUGAR FREE 64)  30 mL Per Tube Daily  . insulin aspart  2-6 Units Subcutaneous 6 times per day  . niCARDipine  5 mg/hr Intravenous Once  . pantoprazole (PROTONIX) IV  40 mg Intravenous QHS  . potassium chloride  30 mEq Per Tube Q4H   PRN:  sodium chloride, acetaminophen, fentaNYL  Diet:  NPO   Activity:  Bedrest    DVT Prophylaxis: SCDs  CLINICALLY SIGNIFICANT STUDIES Basic Metabolic Panel:  Recent Labs Lab 07/28/14 0359  07/30/14 0305  07/30/14 2055 07/31/14 0310  NA 145  < > 160*  < > 160* 160*  K 4.0  < > 3.1*  --   --  3.2*  CL 110  < > 125*  --   --  124*  CO2 21  < > 22  --   --  25  GLUCOSE 132*  < > 163*  --   --  156*  BUN 13  < > 24*  --   --  29*  CREATININE 0.66  < > 0.79  --   --  0.70  CALCIUM 8.6  < > 9.3  --   --  8.9  MG 2.0  --   --   --   --  2.2  PHOS 2.6  --   --   --   --  2.9  < > = values in this interval not displayed. Liver Function Tests:   Recent Labs Lab 08/24/14 1850 08-24-14 2115  AST 27 31  ALT 15 17  ALKPHOS 119* 116  BILITOT 0.4 1.0  PROT 7.6 7.8  ALBUMIN 4.2 4.4   CBC:  Recent Labs Lab 24-Aug-2014 2115  07/30/14 0305 07/31/14 0310  WBC 24.4*  < > 18.2* 12.1*  NEUTROABS 21.0*  --   --  10.1*  HGB 14.0  < > 11.1* 10.8*  HCT 41.3  < > 33.5* 33.2*  MCV 91.0  < > 92.0 92.5  PLT 192  < > 159 148*  < > = values in this interval not  displayed. Coagulation:   Recent Labs Lab 08-24-14 1850  LABPROT 13.8  INR 1.06   Cardiac Enzymes:   Recent Labs Lab August 24, 2014 2115 07/28/14 0200 07/28/14 0845  TROPONINI <0.30 <0.30 <0.30   Urinalysis: No results found for this basename: COLORURINE, APPERANCEUR, LABSPEC, PHURINE, GLUCOSEU, HGBUR, BILIRUBINUR, KETONESUR, PROTEINUR, UROBILINOGEN, NITRITE, LEUKOCYTESUR,  in the last 168 hours Lipid Panel    Component Value Date/Time   CHOL 215* Aug 24, 2014 2115   TRIG 266* 07/30/2014 2055   HDL 50 August 24, 2014 2115   CHOLHDL 4.3 2014-08-24 2115   VLDL 30 08/24/2014 2115   LDLCALC 135* 24-Aug-2014 2115   HgbA1C  Lab Results  Component Value Date   HGBA1C 5.9* Aug 24, 2014    Urine Drug Screen:     Component Value Date/Time   LABOPIA NONE DETECTED 24-Aug-2014 2306   COCAINSCRNUR NONE DETECTED 08/24/14 2306   LABBENZ POSITIVE* 2014-08-24 2306   AMPHETMU NONE DETECTED 08/24/14 2306   THCU NONE DETECTED 24-Aug-2014 2306   LABBARB NONE DETECTED 2014-08-24 2306    Alcohol Level: No results found for this basename: ETH,  in the last 168 hours  Ct Head Wo Contrast  07/31/2014   CLINICAL DATA:  Followup intracranial hemorrhage  EXAM: CT HEAD WITHOUT CONTRAST  TECHNIQUE: Contiguous axial images were obtained from the base of the skull through the vertex without intravenous contrast.  COMPARISON:  Brain MRI 07/29/2014  FINDINGS: There is a large intraparenchymal hemorrhage within the right parietal lobe measuring 7.3 by 4.2 cm compared to 7.1 x 3.5 cm. No significant change in size allowing for different techniques. There is persistent mass effect with 11 mm of leftward midline shift compared to 12 mm on prior. There is persistent compression of the right lateral ventricle. No intraventricular hemorrhage evident. There is axis extra-axial hemorrhage along the tentorium and interhemispheric fissure which is also similar prior.  There is effacement of the quadrigeminal plate cistern. The suprasellar cistern is  patent. Fourth ventricle is patent.  IMPRESSION: 1. No significant interval change in large intraparenchymal hemorrhage in the right parietal lobe. 2. Stable leftward midline shift with a compression of the right lateral ventricle. 3. Extra-axial hemorrhage along the tentorium and the interhemispheric fissure is similar.   Electronically Signed   By: Loura Halt.D.  On: 07/31/2014 07:16   Mr Maxine Glenn Head Wo Contrast  07/29/2014   CLINICAL DATA:  Intracranial hemorrhage.  EXAM: MRI HEAD WITHOUT CONTRAST  MRA HEAD WITHOUT CONTRAST  TECHNIQUE: Multiplanar, multiecho pulse sequences of the brain and surrounding structures were obtained without intravenous contrast. Angiographic images of the head were obtained using MRA technique without contrast.  COMPARISON:  Head CT 07/28/2014  FINDINGS: MRI HEAD FINDINGS  Large intraparenchymal hemorrhage is again seen involving the posterior right temporal, right occipital, and right parietal lobes, similar to recent CT, with blood-fluid levels present. The hemorrhage measures up to approximately 7.1 x 3.5 cm on axial images. There are a few punctate foci of acute infarction separate from this in the right corona radiata, right frontal operculum, and left parietal operculum. There is moderate edema surrounding the hemorrhage with partial effacement of the posterior right lateral ventricle, partial effacement of the basilar cisterns, mild mass effect on the midbrain, and 12 mm of leftward midline shift. Small right frontal subdural hematoma measures up to approximately 5 mm in thickness. A small amount of subdural hemorrhage is also present along the right aspect of the falx posteriorly. Abnormal FLAIR hyperintensity within cerebral sulci bilaterally is favored to represent artifact related to mechanical ventilation and hyperoxygenation given lack of evidence of subarachnoid hemorrhage on the prior CTs.  Patchy T2 hyperintensities L2 where in the cerebral white matter  bilaterally are nonspecific but compatible with mild to moderate chronic small vessel ischemic disease. There is no frank hydrocephalus. Prior bilateral cataract extraction is noted. Paranasal sinuses are clear. There is trace left mastoid fluid. Major intracranial vascular flow voids are preserved.  MRA HEAD FINDINGS  Visualized distal vertebral arteries are patent with the right being minimally larger than the left. PICA origins are patent. AICA and SCA origins are patent. Basilar artery is patent without evidence of significant stenosis. Mildly diminished flow related enhancement and apparent mild narrowing of the proximal to mid basilar artery is favored to be artifactual due to motion. There is a focal, mild to moderate stenosis of the left PCA near the P1-P2 junction. No significant proximal right PCA stenosis is identified. Mild to moderate PCA branch vessel irregularity is present bilaterally. Posterior communicating arteries are not clearly identified.  Internal carotid arteries are patent from skullbase to carotid termini. Proximal MCAs are unremarkable. There is moderate right MCA branch vessel irregularity and attenuation, with mild left MCA branch vessel irregularity noted. The right A1 segment is hypoplastic and mildly irregular. The region of the anterior communicating artery appears somewhat bulbous. A discrete aneurysm is not definitely identified, however evaluation is limited by motion through this region. ACAs are otherwise unremarkable aside from mild branch vessel irregularity.  IMPRESSION: 1. 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 2. Separate, punctate, acute infarcts in the right corona radiata, right frontal operculum, and left parietal operculum. 3. Small amount right-sided subdural hematoma. 4. No evidence of major intracranial arterial occlusion. Moderate right M2 branch  vessel irregularity and attenuation. 5. Mild to moderate proximal left PCA stenosis. 6. Bulbous appearance of the anterior communicating artery. Evaluation limited by motion, however underlying aneurysm not excluded.   Electronically Signed   By: Sebastian Ache   On: 07/29/2014 20:33   Mr Brain Wo Contrast  07/29/2014   CLINICAL DATA:  Intracranial hemorrhage.  EXAM: MRI HEAD WITHOUT CONTRAST  MRA HEAD WITHOUT CONTRAST  TECHNIQUE: Multiplanar, multiecho pulse sequences  of the brain and surrounding structures were obtained without intravenous contrast. Angiographic images of the head were obtained using MRA technique without contrast.  COMPARISON:  Head CT 07/28/2014  FINDINGS: MRI HEAD FINDINGS  Large intraparenchymal hemorrhage is again seen involving the posterior right temporal, right occipital, and right parietal lobes, similar to recent CT, with blood-fluid levels present. The hemorrhage measures up to approximately 7.1 x 3.5 cm on axial images. There are a few punctate foci of acute infarction separate from this in the right corona radiata, right frontal operculum, and left parietal operculum. There is moderate edema surrounding the hemorrhage with partial effacement of the posterior right lateral ventricle, partial effacement of the basilar cisterns, mild mass effect on the midbrain, and 12 mm of leftward midline shift. Small right frontal subdural hematoma measures up to approximately 5 mm in thickness. A small amount of subdural hemorrhage is also present along the right aspect of the falx posteriorly. Abnormal FLAIR hyperintensity within cerebral sulci bilaterally is favored to represent artifact related to mechanical ventilation and hyperoxygenation given lack of evidence of subarachnoid hemorrhage on the prior CTs.  Patchy T2 hyperintensities L2 where in the cerebral white matter bilaterally are nonspecific but compatible with mild to moderate chronic small vessel ischemic disease. There is no frank  hydrocephalus. Prior bilateral cataract extraction is noted. Paranasal sinuses are clear. There is trace left mastoid fluid. Major intracranial vascular flow voids are preserved.  MRA HEAD FINDINGS  Visualized distal vertebral arteries are patent with the right being minimally larger than the left. PICA origins are patent. AICA and SCA origins are patent. Basilar artery is patent without evidence of significant stenosis. Mildly diminished flow related enhancement and apparent mild narrowing of the proximal to mid basilar artery is favored to be artifactual due to motion. There is a focal, mild to moderate stenosis of the left PCA near the P1-P2 junction. No significant proximal right PCA stenosis is identified. Mild to moderate PCA branch vessel irregularity is present bilaterally. Posterior communicating arteries are not clearly identified.  Internal carotid arteries are patent from skullbase to carotid termini. Proximal MCAs are unremarkable. There is moderate right MCA branch vessel irregularity and attenuation, with mild left MCA branch vessel irregularity noted. The right A1 segment is hypoplastic and mildly irregular. The region of the anterior communicating artery appears somewhat bulbous. A discrete aneurysm is not definitely identified, however evaluation is limited by motion through this region. ACAs are otherwise unremarkable aside from mild branch vessel irregularity.  IMPRESSION: 1. 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 2. Separate, punctate, acute infarcts in the right corona radiata, right frontal operculum, and left parietal operculum. 3. Small amount right-sided subdural hematoma. 4. No evidence of major intracranial arterial occlusion. Moderate right M2 branch vessel irregularity and attenuation. 5. Mild to moderate proximal left PCA stenosis. 6. Bulbous appearance of the anterior  communicating artery. Evaluation limited by motion, however underlying aneurysm not excluded.   Electronically Signed   By: Sebastian AcheAllen  Grady   On: 07/29/2014 20:33   Dg Chest Port 1 View  07/31/2014   CLINICAL DATA:  check ETT  EXAM: PORTABLE CHEST - 1 VIEW  COMPARISON:  07/29/2014  FINDINGS: Tubular structures stable. Clear lungs. Normal heart size. No pneumothorax.  IMPRESSION: No active disease.   Electronically Signed   By: Maryclare BeanArt  Hoss M.D.   On: 07/31/2014 07:38   Dg Abd Portable 1v  07/29/2014  CLINICAL DATA:  Orogastric tube position.  EXAM: PORTABLE ABDOMEN - 1 VIEW  COMPARISON:  None.  FINDINGS: Orogastric tube is present with the tip in the mid gastric fundus. Redundant loop is present near the cardia of the stomach. Monitoring leads project over the chest and abdomen. Bowel gas pattern appears within normal limits.  IMPRESSION: Orogastric tube tip in the mid gastric fundus.   Electronically Signed   By: Andreas Newport M.D.   On: 07/29/2014 10:11    MRI of the brain  ordered  MRA of the brain  ordered  Carotid Doppler 1-39% bilateral stenosis  2D Echocardiogram  Left ventricle: The cavity size was normal. Wall thickness was normal. Systolic function was normal. The estimated ejection fraction was in the range of 55% to 60%. Wall motion was normal; there were no regional wall motion abnormalities. Doppler parameters are consistent with abnormal left ventricular relaxation (grade 1 diastolic dysfunction).   CXR  No radiographic evidence of acute cardiopulmonary disease.      EKG    Sinus tachycardia Nonspecific ST abnormality  Therapy Recommendations pending Physical Exam  Frail elderly caucasian lady not in distress..Intubated. Afebrile. Head is nontraumatic. Neck is supple without bruit.   Cardiac exam no murmur or gallop. Lungs are clear to auscultation. Distal pulses are well felt. Neurological Exam ;  Stuporose.. Not following any commands.Pupils are 2 mm reactive. Corneal  reflexes are present. Dolls eye movements are sluggish. Fundi were not visualized. No obvious facial weakness. Tongue midline. Moves right side spontaneously and semi-purposefully against gravity. Mild left lower and upper extremity movements to painful stimuli. No decerebrate posturing .Tone is decreased on the left. Left plantar is upgoing right is downgoing.  ASSESSMENT Rachael Rivas is a 73 y.o. female presenting with sudden onset of mental status changes with nausea vomiting. Imaging confirms a right parieto-occipital large intracerebral hematoma with surrounding cytotoxic edema and 17mm right-to-left midline shift and cerebral herniation. There is also associated tentorial subdural and right convexity subdural hematoma. Etiology of the hemorrhage is indeterminate differential includes hypertensive  (given adm BP 200/110) versus amyloid angiopathy. Hemorrhagic infarct or metastasis would be less .    On no antiplatelets/anticoagulants prior to admission. And there is no h/o of trauma or fall. Patient with resultant comatose state with left hemiparesis  .   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 Not on statins prior to admission  Respiratory failure secondary to cerebral edema from large brain hemorrhage  Induced Hypernatremia to control cerebral edema   Hospital day # 4  TREATMENT/PLAN  Continue ICU level care with aggressive blood pressure control and keep SBP below   180/100.  Keep normothermic, normoglycemic and euvolemic. Hold hypertonic saline as serum sodium above goal   DVT and GI prophylaxis. Continue tube feeds for nutrition. Keep I =O Continue Lovenox for DVT prophylaxis Prognosis is guarded. Long discussion with husband and brother-in-law regarding her neurological condition, prognosis and answered questions. They agree with tracheostomy tomorrow and PEG tube next week as I doubtt she will be able to wake up and  protect her airway or the next few days.   Discussed with Dr.Feinstein-PCCM & Trauma team [place] This patient is critically ill and at significant risk of neurological worsening, death and care requires constant monitoring of vital signs, hemodynamics,respiratory and cardiac monitoring,review of multiple databases, neurological assessment, discussion with family, other specialists and medical decision making of high complexity.I have made any additions or  clarifications directly to the above note.  I spent 35 minutes of neurocritical care time  in the care of  this patient. Delia Heady, MD      To contact Stroke Continuity provider, please refer to [email or web address]. After hours, contact General Neurology

## 2014-07-31 NOTE — Progress Notes (Signed)
Pt planned for trach/PEG over next several days.  If MD thinks it appropriate, please place [facility].   UR completed.  Carlyle LipaMichelle Lashawne Dura, RN BSN MHA CCM Trauma/Neuro ICU Case Manager [phone]

## 2014-08-01 ENCOUNTER — Inpatient Hospital Stay (HOSPITAL_COMMUNITY): Payer: Medicare Other

## 2014-08-01 ENCOUNTER — Encounter (HOSPITAL_COMMUNITY): Payer: Self-pay | Admitting: Internal Medicine

## 2014-08-01 LAB — URINALYSIS, ROUTINE W REFLEX MICROSCOPIC
BILIRUBIN URINE: NEGATIVE
Glucose, UA: 100 mg/dL — AB
Ketones, ur: NEGATIVE mg/dL
Nitrite: POSITIVE — AB
PROTEIN: 30 mg/dL — AB
Specific Gravity, Urine: 1.027 (ref 1.005–1.030)
Urobilinogen, UA: 1 mg/dL (ref 0.0–1.0)
pH: 5.5 (ref 5.0–8.0)

## 2014-08-01 LAB — CBC
HEMATOCRIT: 36.6 % (ref 36.0–46.0)
HEMOGLOBIN: 11.8 g/dL — AB (ref 12.0–15.0)
MCH: 30.3 pg (ref 26.0–34.0)
MCHC: 32.2 g/dL (ref 30.0–36.0)
MCV: 94.1 fL (ref 78.0–100.0)
Platelets: 164 10*3/uL (ref 150–400)
RBC: 3.89 MIL/uL (ref 3.87–5.11)
RDW: 14.4 % (ref 11.5–15.5)
WBC: 10.8 10*3/uL — ABNORMAL HIGH (ref 4.0–10.5)

## 2014-08-01 LAB — BASIC METABOLIC PANEL
ANION GAP: 14 (ref 5–15)
BUN: 29 mg/dL — AB (ref 6–23)
CHLORIDE: 120 meq/L — AB (ref 96–112)
CO2: 25 mEq/L (ref 19–32)
Calcium: 8.9 mg/dL (ref 8.4–10.5)
Creatinine, Ser: 0.67 mg/dL (ref 0.50–1.10)
GFR calc non Af Amer: 84 mL/min — ABNORMAL LOW (ref >90)
Glucose, Bld: 119 mg/dL — ABNORMAL HIGH (ref 70–99)
POTASSIUM: 3.5 meq/L — AB (ref 3.7–5.3)
Sodium: 159 mEq/L — ABNORMAL HIGH (ref 137–147)

## 2014-08-01 LAB — GLUCOSE, CAPILLARY
GLUCOSE-CAPILLARY: 115 mg/dL — AB (ref 70–99)
GLUCOSE-CAPILLARY: 156 mg/dL — AB (ref 70–99)
GLUCOSE-CAPILLARY: 137 mg/dL — AB (ref 70–99)
Glucose-Capillary: 110 mg/dL — ABNORMAL HIGH (ref 70–99)
Glucose-Capillary: 106 mg/dL — ABNORMAL HIGH (ref 70–99)
Glucose-Capillary: 165 mg/dL — ABNORMAL HIGH (ref 70–99)

## 2014-08-01 LAB — SODIUM
SODIUM: 158 meq/L — AB (ref 137–147)
Sodium: 155 mEq/L — ABNORMAL HIGH (ref 137–147)
Sodium: 155 mEq/L — ABNORMAL HIGH (ref 137–147)

## 2014-08-01 LAB — URINE MICROSCOPIC-ADD ON

## 2014-08-01 LAB — PROTIME-INR
INR: 1.23 (ref 0.00–1.49)
PROTHROMBIN TIME: 15.5 s — AB (ref 11.6–15.2)

## 2014-08-01 MED ORDER — POTASSIUM CHLORIDE 20 MEQ/15ML (10%) PO LIQD
20.0000 meq | ORAL | Status: AC
  Administered 2014-08-01 (×2): 20 meq
  Filled 2014-08-01 (×2): qty 15

## 2014-08-01 MED ORDER — DEXTROSE 5 % IV SOLN
1.0000 g | INTRAVENOUS | Status: DC
  Administered 2014-08-01 – 2014-08-02 (×2): 1 g via INTRAVENOUS
  Filled 2014-08-01 (×2): qty 10

## 2014-08-01 MED ORDER — VECURONIUM BROMIDE 10 MG IV SOLR
5.0000 mg | Freq: Once | INTRAVENOUS | Status: AC
  Administered 2014-08-01: 5 mg via INTRAVENOUS

## 2014-08-01 MED ORDER — MIDAZOLAM HCL 2 MG/2ML IJ SOLN
2.0000 mg | Freq: Once | INTRAMUSCULAR | Status: AC
  Administered 2014-08-01: 2 mg via INTRAVENOUS

## 2014-08-01 MED ORDER — FENTANYL CITRATE 0.05 MG/ML IJ SOLN
100.0000 ug | Freq: Once | INTRAMUSCULAR | Status: AC
  Administered 2014-08-01: 100 ug via INTRAVENOUS

## 2014-08-01 MED ORDER — LABETALOL HCL 5 MG/ML IV SOLN
10.0000 mg | INTRAVENOUS | Status: DC | PRN
  Administered 2014-08-01 – 2014-08-02 (×5): 10 mg via INTRAVENOUS
  Filled 2014-08-01 (×5): qty 4

## 2014-08-01 NOTE — Procedures (Signed)
Name:  Rachael PihBettie E Fitterer MRN:  [id number] DOB:  07-24-1941  OPERATIVE NOTE  Procedure:  Percutaneous tracheostomy.  Indications:  Ventilator-dependent respiratory failure.  Consent:  Procedure, alternatives, risks and benefits discussed with medical POA.  Questions answered.  Consent obtained.  Anesthesia:  Propofol, fentanyl.  Procedure summary:  Appropriate equipment was assembled.  The patient was identified as Rachael Rivas and safety timeout was performed. The patient was placed in supine position with a towel roll behind shoulder blades and neck extended.  Sterile technique was used. The patient's neck and upper chest were prepped using chlorhexidine / alcohol scrub and the field was draped in usual sterile fashion with full body drape. After the adequate sedation / anesthesia was achieved, attention was directed at the midline trachea, where the cricothyroid membrane was palpated. Approximately two fingerbreadths above the sternal notch, a 1 cm verticle incision was created with a scalpel after local infiltration with 0.2% Lidocaine. Then, using Seldinger technique and a percutaneous tracheostomy set, the trachea was entered with a 14 gauge needle with an overlying sheath. This was all confirmed under direct visualization of a fiberoptic flexible bronchoscope. Entrance into the trachea was identified through the third tracheal ring interspace. Following this, a guidewire was inserted. The needle was removed, leaving the sheath and the guidewire intact. Next, the sheath was removed and a small dilator was inserted. The tracheal rings were then dilated. A #6 Shiley was then opened. The balloon was checked. It was placed over a tracheal dilator, which was then advanced over the guidewire and through the previously dilated tract. The Shiley tracheostomy tube was noted to pass in the trachea with little resistance. The guidewire and dilator tubes were removed from the trachea. An inner cannula was placed  through the tracheostomy tube. The tracheostomy was then secured at the anterior neck with 4 monofilament sutures. The oral endotracheal tube was removed and the ventilator was attached to the newly placed tracheostomy tube. Adequate tidal volumes were noted. The cuff was inflated and no evidence of air leak was noted. No evidence of bleeding was noted. At this point, the procedure was concluded. Post-procedure chest x-ray was ordered. Bronch went through trach. No bleeding, wnl  Complications:  No immediate complications were noted.  Hemodynamic parameters and oxygenation remained stable throughout the procedure.  Estimated blood loss:  Less then 5 mL.  Procedure performed per Dr. Annice Pihaniel Feinstein  Brandi Ollis, NP-C [facility] Pgr: [phone] or [phone]    08/01/2014, 10:39 AM   Ccm time 30 min   Mcarthur Rossettianiel J. Tyson AliasFeinstein, MD, FACP Pgr: [phone]  Pulmonary & Critical Care

## 2014-08-01 NOTE — Procedures (Signed)
Bedside Tracheostomy Insertion Procedure Note   Patient Details:   Name: Rachael Rivas DOB: 17-Jun-1941 MRN: [id number]  Procedure: Tracheostomy  Pre Procedure Assessment: ET Tube Size:7.0 ET Tube secured at lip (cm):24 Bite block in place: No Breath Sounds: Clear  Post Procedure Assessment: BP 97/63  Pulse 81  Temp(Src) 100 F (37.8 C) (Core (Comment))  Resp 24  Ht 5\' 7"  (1.702 m)  Wt 135 lb 12.9 oz (61.6 kg)  BMI 21.26 kg/m2  SpO2 100% O2 sats: stable throughout Complications: No apparent complications Patient did tolerate procedure well Tracheostomy Brand:Shiley Tracheostomy Style:Cuffed Tracheostomy Size: 6.0 Tracheostomy Secured MVH:QIONGEXvia:Sutures Tracheostomy Placement Confirmation:Trach cuff visualized and in place and Chest X ray ordered for placement    Leonard DowningFerguson, Timmi Devora Steele 08/01/2014, 11:31 AM

## 2014-08-01 NOTE — Progress Notes (Signed)
eLink Physician-Brief Progress Note Patient Name: Rachael Rivas DOB: 05/07/1941 MRN: [id number]   Date of Service  08/01/2014  HPI/Events of Note   Elevated BP.   eICU Interventions   Will order prn labetalol IV.      Intervention Category Major Interventions: Other:  [name] 08/01/2014, 7:03 PM

## 2014-08-01 NOTE — Progress Notes (Signed)
Stroke Team Progress Note  HISTORY Rachael Rivas is an 73 y.o. female with no known medical history who was taken to the emergency room at [facility] following acute onset headache and nausea and vomiting as well as left side weakness at 6 PM today. CT scan of the head showed large acute hemorrhagic infarction centered in the right parietal and occipital regions with associated subdural extension along the tentorium cerebelli and falx cerebri. Surrounding edema and mass effect were noted with a 10 mm right to left midline shift and early signs of trans-tentorial uncal herniation. Patient showed signs of rapid deterioration including increase in pupillary size of the right and reduced responsiveness as well as increased weakness of left extremities. Blood pressure was markedly elevated at 200/110. She was intubated and placed on mechanical ventilation as well as started on Cardene drip prior to transfer to [facility]. Neurosurgery was consulted and recommended no surgical intervention.  LSN: 6 PM on 08/06/2014  tPA Given: No: Acute ICH     She was admitted to the neuro ICU for further evaluation and treatment.  SUBJECTIVE Less responsive today. Intermittent posturing but not during my exam.Febrile tmax 101. Glucoses ok and fluid balance ok.  OBJECTIVE Most recent Vital Signs: Filed Vitals:   08/01/14 0600 08/01/14 0700 08/01/14 0800 08/01/14 0826  BP: 121/85 173/90 161/92 161/92  Pulse: 83 87 77 88  Temp: 101.3 F (38.5 C) 100.6 F (38.1 C) 100.6 F (38.1 C)   TempSrc:      Resp: 24 24 24 24   Height:      Weight:      SpO2: 100% 100% 100% 100%   CBG (last 3)   Recent Labs  07/31/14 2336 08/01/14 0313 08/01/14 0758  GLUCAP 148* 110* 115*    IV Fluid Intake:   . feeding supplement (JEVITY 1.2 CAL) Stopped (08/01/14 0000)  . niCARDipine Stopped (07/30/14 1137)  . propofol 30 mcg/kg/min (08/01/14 0308)  . sodium chloride (hypertonic) Stopped (07/29/14 0315)    MEDICATIONS  . antiseptic  oral rinse  7 mL Mouth Rinse QID  . chlorhexidine  15 mL Mouth Rinse BID  . enoxaparin (LOVENOX) injection  40 mg Subcutaneous Q24H  . etomidate  40 mg Intravenous Once  . feeding supplement (PRO-STAT SUGAR FREE 64)  30 mL Per Tube Daily  . fentaNYL  200 mcg Intravenous Once  . insulin aspart  2-6 Units Subcutaneous 6 times per day  . midazolam  4 mg Intravenous Once  . pantoprazole (PROTONIX) IV  40 mg Intravenous QHS  . potassium chloride  20 mEq Per Tube Q4H  . propofol  5-70 mcg/kg/min Intravenous Once  . vecuronium  10 mg Intravenous Once   PRN:  sodium chloride, acetaminophen, fentaNYL  Diet:  NPO   Activity:  Bedrest    DVT Prophylaxis: SCDs, Lovenox 40 mg sq daily   CLINICALLY SIGNIFICANT STUDIES Basic Metabolic Panel:  Recent Labs Lab 07/28/14 0359  07/31/14 0310  07/31/14 2100 08/01/14 0320  NA 145  < > 160*  < > 161* 159*  K 4.0  < > 3.2*  --   --  3.5*  CL 110  < > 124*  --   --  120*  CO2 21  < > 25  --   --  25  GLUCOSE 132*  < > 156*  --   --  119*  BUN 13  < > 29*  --   --  29*  CREATININE 0.66  < > 0.70  --   --  0.67  CALCIUM 8.6  < > 8.9  --   --  8.9  MG 2.0  --  2.2  --   --   --   PHOS 2.6  --  2.9  --   --   --   < > = values in this interval not displayed. Liver Function Tests:   Recent Labs Lab August 14, 2014 1850 Aug 14, 2014 2115  AST 27 31  ALT 15 17  ALKPHOS 119* 116  BILITOT 0.4 1.0  PROT 7.6 7.8  ALBUMIN 4.2 4.4   CBC:  Recent Labs Lab 08/14/2014 2115  07/31/14 0310 08/01/14 0320  WBC 24.4*  < > 12.1* 10.8*  NEUTROABS 21.0*  --  10.1*  --   HGB 14.0  < > 10.8* 11.8*  HCT 41.3  < > 33.2* 36.6  MCV 91.0  < > 92.5 94.1  PLT 192  < > 148* 164  < > = values in this interval not displayed. Coagulation:   Recent Labs Lab Aug 14, 2014 1850 08/01/14 0320  LABPROT 13.8 15.5*  INR 1.06 1.23   Cardiac Enzymes:   Recent Labs Lab 2014/08/14 2115 07/28/14 0200 07/28/14 0845  TROPONINI <0.30 <0.30 <0.30   Urinalysis: No results found for  this basename: COLORURINE, APPERANCEUR, LABSPEC, PHURINE, GLUCOSEU, HGBUR, BILIRUBINUR, KETONESUR, PROTEINUR, UROBILINOGEN, NITRITE, LEUKOCYTESUR,  in the last 168 hours Lipid Panel    Component Value Date/Time   CHOL 215* 08/14/2014 2115   TRIG 266* 07/30/2014 2055   HDL 50 Aug 14, 2014 2115   CHOLHDL 4.3 August 14, 2014 2115   VLDL 30 08/14/14 2115   LDLCALC 135* August 14, 2014 2115   HgbA1C  Lab Results  Component Value Date   HGBA1C 5.9* 08-14-2014    Urine Drug Screen:     Component Value Date/Time   LABOPIA NONE DETECTED 08-14-14 2306   COCAINSCRNUR NONE DETECTED 08-14-14 2306   LABBENZ POSITIVE* 14-Aug-2014 2306   AMPHETMU NONE DETECTED 2014/08/14 2306   THCU NONE DETECTED 14-Aug-2014 2306   LABBARB NONE DETECTED 08-14-2014 2306    Alcohol Level: No results found for this basename: ETH,  in the last 168 hours  Ct Head Wo Contrast  07/31/2014   CLINICAL DATA:  Followup intracranial hemorrhage  EXAM: CT HEAD WITHOUT CONTRAST  TECHNIQUE: Contiguous axial images were obtained from the base of the skull through the vertex without intravenous contrast.  COMPARISON:  Brain MRI 07/29/2014  FINDINGS: There is a large intraparenchymal hemorrhage within the right parietal lobe measuring 7.3 by 4.2 cm compared to 7.1 x 3.5 cm. No significant change in size allowing for different techniques. There is persistent mass effect with 11 mm of leftward midline shift compared to 12 mm on prior. There is persistent compression of the right lateral ventricle. No intraventricular hemorrhage evident. There is axis extra-axial hemorrhage along the tentorium and interhemispheric fissure which is also similar prior.  There is effacement of the quadrigeminal plate cistern. The suprasellar cistern is patent. Fourth ventricle is patent.  IMPRESSION: 1. No significant interval change in large intraparenchymal hemorrhage in the right parietal lobe. 2. Stable leftward midline shift with a compression of the right lateral ventricle. 3.  Extra-axial hemorrhage along the tentorium and the interhemispheric fissure is similar.   Electronically Signed   By: Genevive Bi M.D.   On: 07/31/2014 07:16   Dg Chest Port 1 View  07/31/2014   CLINICAL DATA:  check ETT  EXAM: PORTABLE CHEST - 1 VIEW  COMPARISON:  07/29/2014  FINDINGS: Tubular structures stable. Clear lungs.  Normal heart size. No pneumothorax.  IMPRESSION: No active disease.   Electronically Signed   By: Maryclare BeanArt  Hoss M.D.   On: 07/31/2014 07:38    MRI of the brain  ordered  MRA of the brain  ordered  Carotid Doppler 1-39% bilateral stenosis  2D Echocardiogram  Left ventricle: The cavity size was normal. Wall thickness was normal. Systolic function was normal. The estimated ejection fraction was in the range of 55% to 60%. Wall motion was normal; there were no regional wall motion abnormalities. Doppler parameters are consistent with abnormal left ventricular relaxation (grade 1 diastolic dysfunction).   EKG    Sinus tachycardia Nonspecific ST abnormality  Therapy Recommendations pending Physical Exam  Frail elderly caucasian lady not in distress..Intubated. Afebrile. Head is nontraumatic. Neck is supple without bruit.   Cardiac exam no murmur or gallop. Lungs are clear to auscultation. Distal pulses are well felt. Neurological Exam ;  Stuporose.. Not following any commands.Pupils are 2 mm reactive. Corneal reflexes are present. Dolls eye movements are sluggish. Fundi were not visualized. No obvious facial weakness. Tongue midline. Moves right side spontaneously and semi-purposefully against gravity. Mild left lower and upper extremity movements to painful stimuli. No decerebrate posturing .Tone is decreased on the left. Left plantar is upgoing right is downgoing.  ASSESSMENT Rachael Rivas is a 73 y.o. female presenting with sudden onset of mental status changes with nausea vomiting. Imaging confirms a right parieto-occipital large intracerebral hematoma with  surrounding cytotoxic edema and 17mm right-to-left midline shift and cerebral herniation. There is also associated tentorial subdural and right convexity subdural hematoma. Etiology of the hemorrhage is indeterminate differential includes hypertensive  (given adm BP 200/110) versus amyloid angiopathy. Hemorrhagic infarct or metastasis would be less .    On no antiplatelets/anticoagulants prior to admission. And there is no h/o of trauma or fall. Patient with resultant comatose state with left hemiparesis  .   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 Not on statins prior to admission  Respiratory failure secondary to cerebral edema from large brain hemorrhage  Induced Hypernatremia to control cerebral edema- sodium 158 today   Hospital day # 5  TREATMENT/PLAN  Continue ICU level care with aggressive blood pressure control and keep SBP below   180/100.  Keep normothermic, normoglycemic and euvolemic. Hold hypertonic saline as serum sodium above goal   DVT and GI prophylaxis. Continue tube feeds for nutrition. Keep I =O Continue Lovenox for DVT prophylaxis Prognosis is guarded. For trach today and likely PEG on Monday 08/18/2014 Repeat Ct head in am   Discussed with Dr.Feinstein-PCCM & patient`s husband This patient is critically ill and at significant risk of neurological worsening, death and care requires constant monitoring of vital signs, hemodynamics,respiratory and cardiac monitoring,review of multiple databases, neurological assessment, discussion with family, other specialists and medical decision making of high complexity.I have made any additions or clarifications directly to the above note.  I spent 32 minutes of neurocritical care time  in the care of  this patient. Delia HeadyPramod Sethi, MD      To contact Stroke Continuity provider, please refer to [email or web address]. After hours, contact General Neurology

## 2014-08-01 NOTE — Progress Notes (Signed)
PULMONARY / CRITICAL CARE MEDICINE   Name: ARILYNN BLAKENEY MRN: [id number] DOB: [birth year]    ADMISSION DATE:  07/19/2014  CHIEF COMPLAINT:  Headache  INITIAL PRESENTATION: 73 ICH, vent  STUDIES:  CT head 8/9:R Parietal ICH Carotid Duplex 8/10 Bilateral: 1-39% ICA stenosis. Vertebral artery flow is antegrade.  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 Separate, punctate, acute infarcts in the right corona radiata,<BR>right frontal operculum, and left parietal operculum.Small amount right-sided subdural hematoma  SIGNIFICANT EVENTS: 8/9 intubated 8/9 CVL insertion, 3% started 8/10 Neuro met with family agreed on DNR status 8/11 3% started 8/12 no progress 8/14 trach placed  SUBJECTIVE:  RN concerned posturing  VITAL SIGNS: Temp:  [100 F (37.8 C)-101.8 F (38.8 C)] 101.1 F (38.4 C) (08/14 1400) Pulse Rate:  [71-110] 77 (08/14 1400) Resp:  [19-24] 24 (08/14 1400) BP: (97-183)/(63-98) 141/81 mmHg (08/14 1400) SpO2:  [95 %-100 %] 99 % (08/14 1400) FiO2 (%):  [30 %-40 %] 40 % (08/14 1400) Weight:  [61.6 kg (135 lb 12.9 oz)] 61.6 kg (135 lb 12.9 oz) (08/14 0540) HEMODYNAMICS: CVP:  [0 mmHg-11 mmHg] 2 mmHg VENTILATOR SETTINGS: Vent Mode:  [-] PRVC FiO2 (%):  [30 %-40 %] 40 % Set Rate:  [24 bmp] 24 bmp Vt Set:  [460 mL] 460 mL PEEP:  [5 cmH20] 5 cmH20 Plateau Pressure:  [16 cmH20] 16 cmH20 INTAKE / OUTPUT:  Intake/Output Summary (Last 24 hours) at 08/01/14 1420 Last data filed at 08/01/14 1400  Gross per 24 hour  Intake  668.6 ml  Output   1005 ml  Net -336.4 ml    PHYSICAL EXAMINATION: General:  Intubated, sedated, does not follow commands Neuro: rass -1, WD noted, do not see posturing HEENT:  jvd slight increased Cardiovascular:  RRR, no loud murmur Lungs: coarse bilat, ronchi Abdomen:  Soft, nontender, no guarding, no bs  wnl Musculoskeletal:  no edema Skin:  No ecchymosis, warm and dry  LABS:  CBC  Recent Labs Lab 07/30/14 0305 07/31/14 0310 08/01/14 0320  WBC 18.2* 12.1* 10.8*  HGB 11.1* 10.8* 11.8*  HCT 33.5* 33.2* 36.6  PLT 159 148* 164   Coag's  Recent Labs Lab 08/05/2014 1850 08/01/14 0320  APTT 24  --   INR 1.06 1.23   BMET  Recent Labs Lab 07/30/14 0305  07/31/14 0310  07/31/14 2100 08/01/14 0320 08/01/14 0830  NA 160*  < > 160*  < > 161* 159* 158*  K 3.1*  --  3.2*  --   --  3.5*  --   CL 125*  --  124*  --   --  120*  --   CO2 22  --  25  --   --  25  --   BUN 24*  --  29*  --   --  29*  --   CREATININE 0.79  --  0.70  --   --  0.67  --   GLUCOSE 163*  --  156*  --   --  119*  --   < > = values in this interval not displayed. Electrolytes  Recent Labs Lab 07/26/2014 2115  07/28/14 0359  07/30/14 0305 07/31/14 0310 08/01/14 0320  CALCIUM 8.9  < > 8.6  < > 9.3 8.9 8.9  MG 2.0  --  2.0  --   --  2.2  --   PHOS 1.9*  --  2.6  --   --  2.9  --   < > = values in this interval not displayed. Sepsis Markers  Recent Labs Lab 08/18/2014 2115  LATICACIDVEN 2.5*   ABG  Recent Labs Lab 07/30/2014 2258 07/28/14 0542 07/29/14 0500  PHART 7.450 7.440 7.408  PCO2ART 32.9* 31.7* 31.3*  PO2ART 91.1 93.9 118.0*   Liver Enzymes  Recent Labs Lab 08/11/2014 1850 07/30/2014 2115  AST 27 31  ALT 15 17  ALKPHOS 119* 116  BILITOT 0.4 1.0  ALBUMIN 4.2 4.4   Cardiac Enzymes  Recent Labs Lab 08/17/2014 2115 07/28/14 0200 07/28/14 0845  TROPONINI <0.30 <0.30 <0.30   Glucose  Recent Labs Lab 07/31/14 1604 07/31/14 1928 07/31/14 2336 08/01/14 0313 08/01/14 0758 08/01/14 1206  GLUCAP 156* 144* 148* 110* 115* 106*    Imaging Ct Head Wo Contrast  07/31/2014   CLINICAL DATA:  Followup intracranial hemorrhage  EXAM: CT HEAD WITHOUT CONTRAST  TECHNIQUE: Contiguous axial images were obtained from the base of the skull through the vertex without intravenous contrast.   COMPARISON:  Brain MRI 07/29/2014  FINDINGS: There is a large intraparenchymal hemorrhage within the right parietal lobe measuring 7.3 by 4.2 cm compared to 7.1 x 3.5 cm. No significant change in size allowing for different techniques. There is persistent mass effect with 11 mm of leftward midline shift compared to 12 mm on prior. There is persistent compression of the right lateral ventricle. No intraventricular hemorrhage evident. There is axis extra-axial hemorrhage along the tentorium and interhemispheric fissure which is also similar prior.  There is effacement of the quadrigeminal plate cistern. The suprasellar cistern is patent. Fourth ventricle is patent.  IMPRESSION: 1. No significant interval change in large intraparenchymal hemorrhage in the right parietal lobe. 2. Stable leftward midline shift with a compression of the right lateral ventricle. 3. Extra-axial hemorrhage along the tentorium and the interhemispheric fissure is similar.   Electronically Signed   By: Suzy Bouchard M.D.   On: 07/31/2014 07:16   Dg Chest Port 1 View  07/31/2014   CLINICAL DATA:  check ETT  EXAM: PORTABLE CHEST - 1 VIEW  COMPARISON:  07/29/2014  FINDINGS: Tubular structures stable. Clear lungs. Normal heart size. No pneumothorax.  IMPRESSION: No active disease.   Electronically Signed   By: Maryclare Bean M.D.   On: 07/31/2014 07:38   CT head: 1. 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 There is a large amount of surrounding edema and associated mass effect, with extensive right to left midline shift and evidence suggesting early transtentorial uncal herniation, as above   ASSESSMENT / PLAN:  PULMONARY OETT 8/9 A: VDRF due to altered mental status due to [place] P:   Trach done then consider cpap 5 ps 5, , no immediate escalation to trach collar as of now with prior apnea Did  wean 30 min 8/12 then was apnic pcxr post trach on order  CARDIOVASCULAR CVL 8/9 [facility]>> A: HTN P: Tele cvp 11 noted, kvo  RENAL A:  Induced hypernatremia, hypokalemia P:   Dc cvp Allow na to drift further down  GASTROINTESTINAL A:  No acute issue P:   Tf to goal , npo 5 am ppi Peg Monday called appreciate trauma help  HEMATOLOGIC A:  dvt prevention P:  Monitor cbc in am  scd Repeat coags wnl  INFECTIOUS A:  fever P:   Cont with monitor pcxr in am for infiltrate Send  sputum If spike over 101.5 add ceftaz, vanc UA  ENDOCRINE A:  No acute issue P:   Monitor blood sugar, goal 140-180  NEUROLOGIC A:  Large R ICH likely hypertensive bleed, etiology unclear P:   BP controlled currently Allow na  To drift Adequate sedation, RASS goal: 0 if able HOB elevated  TODAY'S SUMMARY: trach, wean cpap, fevers  CC time - 30 mins  Lavon Paganini. Titus Mould, MD, [place] Pgr: [place] [facility]

## 2014-08-01 NOTE — Procedures (Signed)
Bronchoscopy Procedure Note Rachael PihBettie E Rivas [id number] [birth year]  Procedure: Bronchoscopy Indications: Placement of Tracheostomy  Procedure Details Consent: Risks of procedure as well as the alternatives and risks of each were explained to the (patient/caregiver).  Consent for procedure obtained. Time Out: Verified patient identification, verified procedure, site/side was marked, verified correct patient position, special equipment/implants available, medications/allergies/relevent history reviewed, required imaging and test results available.  Performed  In preparation for procedure, patient was given 100% FiO2 and bronchoscope lubricated. Sedation: Propofol, fentanyl.  Airway entered and the following bronchi were examined: RUL and LUL.   Procedures performed: Placement of tracheostomy  Bronchoscope removed.    Evaluation Hemodynamic Status: BP stable throughout; O2 sats: stable throughout Patient's Current Condition: stable Specimens:  None Complications: No apparent complications Patient did tolerate procedure well.   Procedure performed under direct supervision of Dr. Molli KnockYacoub.  Canary BrimBrandi Ollis, NP-C  Pulmonary & Critical Care Pgr: [phone] or [phone]  I was present and supervised the entire procedure.  Alyson ReedyWesam G. Yacoub, M.D. [facility]. Pager: [phone]. After hours pager: [phone].  08/01/2014

## 2014-08-01 NOTE — Procedures (Signed)
Perc trach See full dictation Blood loss 5 cc Size 6 placed Shiley  Temple Ewart J. Katiana Ruland, MTyson Alias, FACP Pgr: [phone] [place] [facility]

## 2014-08-01 NOTE — Progress Notes (Signed)
eLink Physician-Brief Progress Note Patient Name: Gladstone PihBettie E Jaso DOB: [birth year] MRN: [id number]   Date of Service  08/01/2014  HPI/Events of Note  Fever, u/a suggestive of UTI.  eICU Interventions  Will add rocephin.     Intervention Category Major Interventions: Other:  Jhana Giarratano 08/01/2014, 5:55 PM

## 2014-08-02 ENCOUNTER — Inpatient Hospital Stay (HOSPITAL_COMMUNITY): Payer: Medicare Other

## 2014-08-02 LAB — GLUCOSE, CAPILLARY
GLUCOSE-CAPILLARY: 138 mg/dL — AB (ref 70–99)
GLUCOSE-CAPILLARY: 167 mg/dL — AB (ref 70–99)
Glucose-Capillary: 151 mg/dL — ABNORMAL HIGH (ref 70–99)
Glucose-Capillary: 162 mg/dL — ABNORMAL HIGH (ref 70–99)
Glucose-Capillary: 131 mg/dL — ABNORMAL HIGH (ref 70–99)

## 2014-08-02 LAB — SODIUM
SODIUM: 154 meq/L — AB (ref 137–147)
SODIUM: 152 meq/L — AB (ref 137–147)
Sodium: 151 mEq/L — ABNORMAL HIGH (ref 137–147)

## 2014-08-02 NOTE — Progress Notes (Signed)
Stroke Team Progress Note  HISTORY Rachael Rivas is a 73 y.o. female with no known medical history who was taken to the emergency room at [facility] following acute onset headache and nausea and vomiting as well as left side weakness at 6 PM today. CT scan of the head showed large acute hemorrhagic infarction centered in the right parietal and occipital regions with associated subdural extension along the tentorium cerebelli and falx cerebri. Surrounding edema and mass effect were noted with a 10 mm right to left midline shift and early signs of trans-tentorial uncal herniation. Patient showed signs of rapid deterioration including increase in pupillary size of the right and reduced responsiveness as well as increased weakness of left extremities. Blood pressure was markedly elevated at 200/110. She was intubated and placed on mechanical ventilation as well as started on Cardene drip prior to transfer to [facility]. Neurosurgery was consulted and recommended no surgical intervention.  LSN: 6 PM on 08/08/2014  tPA Given: No: Acute ICH     She was admitted to the neuro ICU for further evaluation and treatment.  SUBJECTIVE Dr Loretha Brasil discussed patient's prognosis with the family. Images reviewed. Questions answered.  OBJECTIVE Most recent Vital Signs: Filed Vitals:   08/02/14 0700 08/02/14 0730 08/02/14 0800 08/02/14 0900  BP: 163/82 179/94 162/80 170/80  Pulse: 85 115 74   Temp: 100.8 F (38.2 C)  100.6 F (38.1 C) 100.2 F (37.9 C)  TempSrc:      Resp: 19 26 24 19   Height:      Weight:      SpO2: 99% 100% 100%    CBG (last 3)   Recent Labs  08/01/14 2310 08/02/14 0233 08/02/14 0808  GLUCAP 137* 151* 138*    IV Fluid Intake:   . feeding supplement (JEVITY 1.2 CAL) 1,000 mL (08/01/14 1500)  . niCARDipine Stopped (07/30/14 1137)  . propofol 15 mcg/kg/min (08/02/14 0910)  . sodium chloride (hypertonic) Stopped (07/29/14 0315)    MEDICATIONS  . antiseptic oral rinse  7 mL Mouth Rinse QID   . cefTRIAXone (ROCEPHIN)  IV  1 g Intravenous Q24H  . chlorhexidine  15 mL Mouth Rinse BID  . enoxaparin (LOVENOX) injection  40 mg Subcutaneous Q24H  . feeding supplement (PRO-STAT SUGAR FREE 64)  30 mL Per Tube Daily  . insulin aspart  2-6 Units Subcutaneous 6 times per day  . pantoprazole (PROTONIX) IV  40 mg Intravenous QHS   PRN:  sodium chloride, acetaminophen, fentaNYL, labetalol  Diet:     tube feedings Activity:  Bedrest    DVT Prophylaxis: SCDs  CLINICALLY SIGNIFICANT STUDIES Basic Metabolic Panel:  Recent Labs Lab 07/28/14 0359  07/31/14 0310  08/01/14 0320  08/02/14 0230 08/02/14 0900  NA 145  < > 160*  < > 159*  < > 154* 152*  K 4.0  < > 3.2*  --  3.5*  --   --   --   CL 110  < > 124*  --  120*  --   --   --   CO2 21  < > 25  --  25  --   --   --   GLUCOSE 132*  < > 156*  --  119*  --   --   --   BUN 13  < > 29*  --  29*  --   --   --   CREATININE 0.66  < > 0.70  --  0.67  --   --   --  CALCIUM 8.6  < > 8.9  --  8.9  --   --   --   MG 2.0  --  2.2  --   --   --   --   --   PHOS 2.6  --  2.9  --   --   --   --   --   < > = values in this interval not displayed. Liver Function Tests:   Recent Labs Lab 08/10/2014 1850 08/01/2014 2115  AST 27 31  ALT 15 17  ALKPHOS 119* 116  BILITOT 0.4 1.0  PROT 7.6 7.8  ALBUMIN 4.2 4.4   CBC:  Recent Labs Lab 08/02/2014 2115  07/31/14 0310 08/01/14 0320  WBC 24.4*  < > 12.1* 10.8*  NEUTROABS 21.0*  --  10.1*  --   HGB 14.0  < > 10.8* 11.8*  HCT 41.3  < > 33.2* 36.6  MCV 91.0  < > 92.5 94.1  PLT 192  < > 148* 164  < > = values in this interval not displayed. Coagulation:   Recent Labs Lab 08/02/2014 1850 08/01/14 0320  LABPROT 13.8 15.5*  INR 1.06 1.23   Cardiac Enzymes:   Recent Labs Lab 08/13/2014 2115 07/28/14 0200 07/28/14 0845  TROPONINI <0.30 <0.30 <0.30   Urinalysis:   Recent Labs Lab 08/01/14 1656  COLORURINE YELLOW  LABSPEC 1.027  PHURINE 5.5  GLUCOSEU 100*  HGBUR SMALL*  BILIRUBINUR  NEGATIVE  KETONESUR NEGATIVE  PROTEINUR 30*  UROBILINOGEN 1.0  NITRITE POSITIVE*  LEUKOCYTESUR MODERATE*   Lipid Panel    Component Value Date/Time   CHOL 215* 08/09/2014 2115   TRIG 266* 07/30/2014 2055   HDL 50 07/28/2014 2115   CHOLHDL 4.3 08/01/2014 2115   VLDL 30 08/05/2014 2115   LDLCALC 135* 07/30/2014 2115   HgbA1C  Lab Results  Component Value Date   HGBA1C 5.9* 08/02/2014    Urine Drug Screen:     Component Value Date/Time   LABOPIA NONE DETECTED 08/02/2014 2306   COCAINSCRNUR NONE DETECTED 07/30/2014 2306   LABBENZ POSITIVE* 08/02/2014 2306   AMPHETMU NONE DETECTED 08/02/2014 2306   THCU NONE DETECTED 08/13/2014 2306   LABBARB NONE DETECTED 08/17/2014 2306    Alcohol Level: No results found for this basename: ETH,  in the last 168 hours  Ct Head Wo Contrast 08/02/2014    1. Increased leftward midline shift is noted, measuring approximately 1.5 cm, compared to 1.1 cm on the prior study. As the patient's large intracranial hemorrhage has not increased significantly in size, differences in the amount of measured midline shift may largely reflect differences in positioning.  2. Large right parietal and occipital lobe intracranial hemorrhage is relatively stable in appearance. Underlying vasogenic edema is mildly more prominent.  3. Increased intracranial hemorrhage along the posterior aspect of the right temporal lobe, measuring 2.7 x 2.1 cm, with perhaps mildly worsened mass effect on the right side of the pons.  4. Stable subdural hematoma along the anterior and posterior falx cerebri, and along the right frontoparietal region. Relatively stable mild hydrocephalus suspected.     Chest Portable 1 View To Assess Tube Placement And Rule-out Pneumothorax 08/01/2014    New tracheostomy tube in appropriate position.  No active disease.      Dg Abd Portable 1v 08/01/2014    Feeding tube tip at the gastric antrum. Advance 5 cm to allow for post pyloric transit.        MRI / MRAof the brain  07/29/2014 1. Large hemorrhagic infarct involving the right temporal, occipital, and parietal lobes with associated moderate edema and 12  mm of leftward midline shift.  2. Separate, punctate, acute infarcts in the right corona radiata, right frontal operculum, and left parietal operculum.  3. Small amount right-sided subdural hematoma.  4. No evidence of major intracranial arterial occlusion. Moderate right M2 branch vessel irregularity and attenuation.  5. Mild to moderate proximal left PCA stenosis.  6. Bulbous appearance of the anterior communicating artery.    Carotid Doppler 1-39% bilateral stenosis  2D Echocardiogram  Left ventricle: The cavity size was normal. Wall thickness was normal. Systolic function was normal. The estimated ejection fraction was in the range of 55% to 60%. Wall motion was normal; there were no regional wall motion abnormalities. Doppler parameters are consistent with abnormal left ventricular relaxation (grade 1 diastolic dysfunction).   CXR  No radiographic evidence of acute cardiopulmonary disease.      EKG    Sinus tachycardia Nonspecific ST abnormality  Therapy Recommendations pending  Physical Exam  Frail elderly caucasian lady not in distress..Intubated. Afebrile. Head is nontraumatic. Neck is supple without bruit.   Cardiac exam no murmur or gallop. Lungs are clear to auscultation. Distal pulses are well felt. Neurological Exam ;  Stuporose.. Not following any commands.Pupils are 2 mm reactive. Corneal reflexes are present. Dolls eye movements are sluggish. Fundi were not visualized. No obvious facial weakness. Tongue midline. Moves right side spontaneously and semi-purposefully against gravity. Mild left lower and upper extremity movements to painful stimuli. No decerebrate posturing .Tone is decreased on the left. Left plantar is upgoing right is downgoing.  ASSESSMENT Ms. Rachael Rivas is a 73 y.o. female presenting with sudden onset of  mental status changes with nausea vomiting. Imaging confirms a right parieto-occipital large intracerebral hematoma with surrounding cytotoxic edema and 17mm right-to-left midline shift and cerebral herniation. There is also associated tentorial subdural and right convexity subdural hematoma. Etiology of the hemorrhage is indeterminate differential includes hypertensive  (given adm BP 200/110) versus amyloid angiopathy. Hemorrhagic infarct or metastasis would be less .    On no antiplatelets/anticoagulants prior to admission. And there is no h/o of trauma or fall. Patient with resultant comatose state with left hemiparesis  .   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 Not on statins prior to admission  Respiratory failure secondary to cerebral edema from large brain hemorrhage  Induced Hypernatremia to control cerebral edema   Hospital day # 6  TREATMENT/PLAN Continue ICU level care with aggressive blood pressure control and keep SBP below   180/100.  Keep normothermic, normoglycemic and euvolemic. Hold hypertonic saline as serum sodium above goal   DVT and GI prophylaxis. Continue tube feeds for nutrition. Keep I =O Continue Lovenox for DVT prophylaxis Prognosis is guarded. Long discussion with husband and brother-in-law regarding her neurological condition, prognosis and answered questions.  Status post tracheostomy PEG planned for Tuesday; however, may not be needed if the family decides on comfort care only.  UTI - on Rocephin   Hassel Neth [facility] Pager [phone] 08/02/2014, 9:59 AM   Significant time spent discussing case with family at bedside.  Pt's family were showing imaging and the repeat CTH which shows increase in the size of the hemorrhage as well as edema Poor prognosis described and likely pt will need lifelong nursing home care and assistance if stabilizes over next few  days/weeks.  Family  appeared to understand and will have family meeting prior to Tuesday to decide Trach/ vs comfort care.  Pauletta Rivas, Rachael Carie This patient is ill and at significant risk of neurological worsening, constant monitoring of vital signs, hemodynamics,respiratory and cardiac monitoring,review of multiple databases, neurological assessment, discussion with family, other specialists and medical decision making of high complexity.I have made any additions or clarifications directly to the above note.       To contact Stroke Continuity provider, please refer to [email or web address]. After hours, contact General Neurology

## 2014-08-02 NOTE — Progress Notes (Signed)
PULMONARY / CRITICAL CARE MEDICINE   Name: Rachael Rivas MRN: [id number] DOB: 1941-11-17    ADMISSION DATE:  08/08/2014  CHIEF COMPLAINT:  Headache  INITIAL PRESENTATION: 73 ICH, vent  STUDIES:  CT head 8/9:R Parietal ICH Carotid Duplex 8/10 Bilateral: 1-39% ICA stenosis. Vertebral artery flow is antegrade.  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 Separate, punctate, acute infarcts in the right corona radiata,<BR>right frontal operculum, and left parietal operculum.Small amount right-sided subdural hematoma Ct head 8/15>>>Increased leftward midline shift is noted, measuring<BR>approximately 1.5 cm, compared to 1.1 cm on the prior study. As the<BR>patient's large intracranial hemorrhage has not increased<BR>significantly in size, differences in the amount of measured midline<BR>shift may largely reflect differences in positioning.<BR>2. Large right parietal and occipital lobe intracranial hemorrhage<BR>is relatively stable in appearance. Underlying vasogenic edema is<BR>mildly more prominent.<BR>  SIGNIFICANT EVENTS: 8/9 intubated 8/9 CVL insertion, 3% started 8/10 Neuro met with family agreed on DNR status 8/11 3% started 8/12 no progress 8/14 trach placed  SUBJECTIVE:  Sedation off  VITAL SIGNS: Temp:  [98.8 F (37.1 C)-101.1 F (38.4 C)] 99.7 F (37.6 C) (08/15 1000) Pulse Rate:  [66-115] 70 (08/15 1000) Resp:  [19-28] 24 (08/15 1000) BP: (121-196)/(62-113) 155/74 mmHg (08/15 1000) SpO2:  [98 %-100 %] 99 % (08/15 1000) FiO2 (%):  [30 %-40 %] 30 % (08/15 1105) Weight:  [61 kg (134 lb 7.7 oz)] 61 kg (134 lb 7.7 oz) (08/15 0400) HEMODYNAMICS:   VENTILATOR SETTINGS: Vent Mode:  [-] PSV;CPAP FiO2 (%):  [30 %-40 %] 30 % Set Rate:  [24 bmp] 24 bmp Vt Set:  [460 mL] 460 mL PEEP:  [5 cmH20] 5 cmH20 Pressure Support:  [5 cmH20] 5 cmH20 Plateau  Pressure:  [14 cmH20-16 cmH20] 15 cmH20 INTAKE / OUTPUT:  Intake/Output Summary (Last 24 hours) at 08/02/14 1125 Last data filed at 08/02/14 1000  Gross per 24 hour  Intake 1159.1 ml  Output    755 ml  Net  404.1 ml    PHYSICAL EXAMINATION: General:  rass 0 Neuro: rass 0, eyes variable, not following commands HEENT:  jvd wnl, trach wnl Cardiovascular:  RRR, no loud murmur Lungs: ronchi mild Abdomen:  Soft, nontender, no guarding, no bs wnl Musculoskeletal:  no edema Skin:  No ecchymosis, warm and dry  LABS:  CBC  Recent Labs Lab 07/30/14 0305 07/31/14 0310 08/01/14 0320  WBC 18.2* 12.1* 10.8*  HGB 11.1* 10.8* 11.8*  HCT 33.5* 33.2* 36.6  PLT 159 148* 164   Coag's  Recent Labs Lab 07/25/2014 1850 08/01/14 0320  APTT 24  --   INR 1.06 1.23   BMET  Recent Labs Lab 07/30/14 0305  07/31/14 0310  08/01/14 0320  08/01/14 2055 08/02/14 0230 08/02/14 0900  NA 160*  < > 160*  < > 159*  < > 155* 154* 152*  K 3.1*  --  3.2*  --  3.5*  --   --   --   --   CL 125*  --  124*  --  120*  --   --   --   --   CO2 22  --  25  --  25  --   --   --   --   BUN 24*  --  29*  --  29*  --   --   --   --   CREATININE 0.79  --  0.70  --  0.67  --   --   --   --   GLUCOSE 163*  --  156*  --  119*  --   --   --   --   < > = values in this interval not displayed. Electrolytes  Recent Labs Lab 08/14/2014 2115  07/28/14 0359  07/30/14 0305 07/31/14 0310 08/01/14 0320  CALCIUM 8.9  < > 8.6  < > 9.3 8.9 8.9  MG 2.0  --  2.0  --   --  2.2  --   PHOS 1.9*  --  2.6  --   --  2.9  --   < > = values in this interval not displayed. Sepsis Markers  Recent Labs Lab 07/31/2014 2115  LATICACIDVEN 2.5*   ABG  Recent Labs Lab 08/05/2014 2258 07/28/14 0542 07/29/14 0500  PHART 7.450 7.440 7.408  PCO2ART 32.9* 31.7* 31.3*  PO2ART 91.1 93.9 118.0*   Liver Enzymes  Recent Labs Lab 08/16/2014 1850 07/20/2014 2115  AST 27 31  ALT 15 17  ALKPHOS 119* 116  BILITOT 0.4 1.0  ALBUMIN  4.2 4.4   Cardiac Enzymes  Recent Labs Lab 07/26/2014 2115 07/28/14 0200 07/28/14 0845  TROPONINI <0.30 <0.30 <0.30   Glucose  Recent Labs Lab 08/01/14 1206 08/01/14 1604 08/01/14 1943 08/01/14 2310 08/02/14 0233 08/02/14 0808  GLUCAP 106* 156* 165* 137* 151* 138*    Imaging Chest Portable 1 View To Assess Tube Placement And Rule-out Pneumothorax  08/01/2014   CLINICAL DATA:  Acute intracranial hemorrhage. Respiratory failure. Tracheostomy tube placement.  EXAM: PORTABLE CHEST - 1 VIEW  COMPARISON:  07/31/2014  FINDINGS: New tracheostomy tube is seen in appropriate position. Left subclavian central venous catheter remains in appropriate position. Nasogastric tube has been removed since prior exam.  No pneumothorax identified. No evidence of pleural effusion. Both lungs are clear. Heart size is normal.  IMPRESSION: New tracheostomy tube in appropriate position.  No active disease.   Electronically Signed   By: Earle Gell M.D.   On: 08/01/2014 11:07   Dg Abd Portable 1v  08/01/2014   CLINICAL DATA:  73 year old female feeding tube placed. Initial encounter.  EXAM: PORTABLE ABDOMEN - 1 VIEW  COMPARISON:  07/29/2014.  FINDINGS: Portable AP supine view at 1307 hrs. Feeding tube tip at the level of the gastric antrum. NG type tube removed. Non obstructed bowel gas pattern. Stable lung bases. Foley catheter or rectal tube partially visible. No acute osseous abnormality identified.  IMPRESSION: Feeding tube tip at the gastric antrum. Advance 5 cm to allow for post pyloric transit.   Electronically Signed   By: Lars Pinks M.D.   On: 08/01/2014 13:28   CT head: 1. 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 There is a large amount of surrounding edema and associated mass effect, with extensive right to left midline shift and evidence suggesting early  transtentorial uncal herniation, as above   ASSESSMENT / PLAN:  PULMONARY OETT 8/9>>>8/14 Trach (df) 8/14>>> A: VDRF due to altered mental status due to [place] P:   Sedation off, wean ps 5, cpap 5, goal 6 hrs today Would be nice with sedation off to consider trach collar re attemtps With worsening shift , if TC done, would abg at 1 hr  CARDIOVASCULAR CVL 8/9 [facility]>> A: HTN P: Tele cvp cvp Dc line if able  RENAL A:  Induced hypernatremia P:  Allow na to drift further down, last chem  Noted Repeat in am  Allow neg balance  GASTROINTESTINAL A:  No acute issue P:   Tf to goal  ppi Peg Monday, may need to dc this with worsening ct and clinical status  HEMATOLOGIC A:  dvt prevention P:  Monitor cbc in am  scd  INFECTIOUS A:  Fever, UTI P:   Foley change Ceftriaxone 8/14>>> Culture urine  ENDOCRINE A:  Controlled glu P:   Monitor blood sugar, goal 140-180  NEUROLOGIC A:  Large R ICH likely hypertensive bleed, etiology unclear, worsening status P:   BP controlled currently Allow na  To drift Adequate sedation, RASS goal: 0 if able HOB elevated Will discuss with neuro role palliative care discussions  TODAY'S SUMMARY: trach, PS, ct worse, palliative care? Which I am happy to have with husband if neuro feels is appropriate  CC time - 30 mins  Lavon Paganini. Titus Mould, MD, [place] Pgr: [facility]

## 2014-08-02 NOTE — Progress Notes (Addendum)
eLink Physician-Brief Progress Note Patient Name: Rachael Rivas DOB: Mar 19, 1941 MRN: [id number]   Date of Service  08/02/2014  HPI/Events of Note  rn called to inform that family wants terminal wean 08/12/2014 at 2pm  eICU Interventions  Labs and other non comfort orders stopped 9:19 PM 08/02/2014      Intervention Category Intermediate Interventions: Other:  Vaniah Chambers 08/02/2014, 5:41 PM

## 2014-08-03 LAB — CULTURE, RESPIRATORY W GRAM STAIN

## 2014-08-03 LAB — CULTURE, RESPIRATORY: SPECIAL REQUESTS: NORMAL

## 2014-08-03 MED ORDER — LORAZEPAM BOLUS VIA INFUSION
2.0000 mg | INTRAVENOUS | Status: DC | PRN
  Filled 2014-08-03: qty 5

## 2014-08-03 MED ORDER — LORAZEPAM 2 MG/ML IJ SOLN
5.0000 mg/h | INTRAVENOUS | Status: DC
  Administered 2014-08-03 (×2): 5 mg/h via INTRAVENOUS
  Filled 2014-08-03 (×2): qty 25

## 2014-08-03 MED ORDER — MORPHINE BOLUS VIA INFUSION
5.0000 mg | INTRAVENOUS | Status: DC | PRN
  Filled 2014-08-03: qty 20

## 2014-08-03 MED ORDER — MORPHINE SULFATE 10 MG/ML IJ SOLN
10.0000 mg/h | INTRAVENOUS | Status: DC
  Administered 2014-08-03 (×2): 10 mg/h via INTRAVENOUS
  Filled 2014-08-03 (×2): qty 10

## 2014-08-04 SURGERY — INSERTION, PEG TUBE
Anesthesia: Moderate Sedation

## 2014-08-04 SURGERY — INSERTION, PEG TUBE
Anesthesia: Choice

## 2014-08-05 LAB — URINE CULTURE

## 2014-08-19 NOTE — Progress Notes (Signed)
PULMONARY / CRITICAL CARE MEDICINE   Name: Rachael Rivas MRN: [id number] DOB: Oct 26, 1941    ADMISSION DATE:  08/02/2014  CHIEF COMPLAINT:  Headache  INITIAL PRESENTATION: 73 ICH, vent  STUDIES:  CT head 8/9:R Parietal ICH Carotid Duplex 8/10 Bilateral: 1-39% ICA stenosis. Vertebral artery flow is antegrade.  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 Separate, punctate, acute infarcts in the right corona radiata,<BR>right frontal operculum, and left parietal operculum.Small amount right-sided subdural hematoma Ct head 8/15>>>Increased leftward midline shift is noted, measuring<BR>approximately 1.5 cm, compared to 1.1 cm on the prior study. As the<BR>patient's large intracranial hemorrhage has not increased<BR>significantly in size, differences in the amount of measured midline<BR>shift may largely reflect differences in positioning.<BR>2. Large right parietal and occipital lobe intracranial hemorrhage<BR>is relatively stable in appearance. Underlying vasogenic edema is<BR>mildly more prominent.<BR>  SIGNIFICANT EVENTS: 8/9 intubated 8/9 CVL insertion, 3% started 8/10 Neuro met with family agreed on DNR status 8/11 3% started 8/12 no progress 8/14 trach placed 8/15- worsening CT  SUBJECTIVE: family at bedside for comfort   VITAL SIGNS: Temp:  [98.9 F (37.2 C)-101.2 F (38.4 C)] 101.2 F (38.4 C) (08/16 0748) Pulse Rate:  [28-444] 107 (08/16 1100) Resp:  [17-30] 24 (08/16 1100) BP: (100-190)/(56-106) 120/62 mmHg (08/16 1100) SpO2:  [97 %-100 %] 97 % (08/16 1100) FiO2 (%):  [30 %] 30 % (08/16 1000) Weight:  [60.4 kg (133 lb 2.5 oz)] 60.4 kg (133 lb 2.5 oz) (08/16 0800) HEMODYNAMICS:   VENTILATOR SETTINGS: Vent Mode:  [-] PRVC FiO2 (%):  [30 %] 30 % Set Rate:  [24 bmp] 24 bmp Vt Set:  [460 mL] 460 mL PEEP:  [5 cmH20] 5 cmH20 Pressure Support:  [5  cmH20] 5 cmH20 Plateau Pressure:  [15 cmH20] 15 cmH20 INTAKE / OUTPUT:  Intake/Output Summary (Last 24 hours) at 2014/08/25 1133 Last data filed at 2014-08-25 1000  Gross per 24 hour  Intake   1224 ml  Output   1310 ml  Net    -86 ml    PHYSICAL EXAMINATION: General:  rass -2 Neuro: rass 0, eyes closed not following commands HEENT:  jvd wnl, trach wnl Cardiovascular:  RRR, no loud murmur Lungs: mild Abdomen:  Soft, nontender, no guarding, no bs wnl Musculoskeletal:  no edema Skin:  No ecchymosis, warm and dry  LABS:  CBC  Recent Labs Lab 07/30/14 0305 07/31/14 0310 08/01/14 0320  WBC 18.2* 12.1* 10.8*  HGB 11.1* 10.8* 11.8*  HCT 33.5* 33.2* 36.6  PLT 159 148* 164   Coag's  Recent Labs Lab 07/25/2014 1850 08/01/14 0320  APTT 24  --   INR 1.06 1.23   BMET  Recent Labs Lab 07/30/14 0305  07/31/14 0310  08/01/14 0320  08/02/14 0230 08/02/14 0900 08/02/14 1600  NA 160*  < > 160*  < > 159*  < > 154* 152* 151*  K 3.1*  --  3.2*  --  3.5*  --   --   --   --   CL 125*  --  124*  --  120*  --   --   --   --   CO2 22  --  25  --  25  --   --   --   --   BUN 24*  --  29*  --  29*  --   --   --   --   CREATININE 0.79  --  0.70  --  0.67  --   --   --   --   GLUCOSE 163*  --  156*  --  119*  --   --   --   --   < > = values in this interval not displayed. Electrolytes  Recent Labs Lab 08/15/2014 2115  07/28/14 0359  07/30/14 0305 07/31/14 0310 08/01/14 0320  CALCIUM 8.9  < > 8.6  < > 9.3 8.9 8.9  MG 2.0  --  2.0  --   --  2.2  --   PHOS 1.9*  --  2.6  --   --  2.9  --   < > = values in this interval not displayed. Sepsis Markers  Recent Labs Lab 07/19/2014 2115  LATICACIDVEN 2.5*   ABG  Recent Labs Lab 08/15/2014 2258 07/28/14 0542 07/29/14 0500  PHART 7.450 7.440 7.408  PCO2ART 32.9* 31.7* 31.3*  PO2ART 91.1 93.9 118.0*   Liver Enzymes  Recent Labs Lab 07/30/2014 1850 07/24/2014 2115  AST 27 31  ALT 15 17  ALKPHOS 119* 116  BILITOT 0.4 1.0   ALBUMIN 4.2 4.4   Cardiac Enzymes  Recent Labs Lab 08/14/2014 2115 07/28/14 0200 07/28/14 0845  TROPONINI <0.30 <0.30 <0.30   Glucose  Recent Labs Lab 08/01/14 2310 08/02/14 0233 08/02/14 0808 08/02/14 1204 08/02/14 1544 08/02/14 1928  GLUCAP 137* 151* 138* 162* 167* 131*    Imaging Ct Head Wo Contrast  08/02/2014   CLINICAL DATA:  Follow-up intracranial hemorrhage.  EXAM: CT HEAD WITHOUT CONTRAST  TECHNIQUE: Contiguous axial images were obtained from the base of the skull through the vertex without intravenous contrast.  COMPARISON:  CT of the head performed 07/31/2012  FINDINGS: The large intracranial hemorrhage at the right parietal and occipital lobes is grossly stable from the prior study, measuring approximately 7.2 x 4.2 cm. Underlying vasogenic edema may be mildly more prominent, as there is approximately 1.5 cm of leftward midline shift on the current study.  Given that the hemorrhage has not increased significantly in size, differences in the amount of measured midline shift may reflect differences in positioning.  There is increased intracranial hemorrhage along the posterior aspect of the right temporal lobe, measuring approximately 2.7 x 2.1 cm. There is perhaps mildly worsened mass effect on the right side of the pons. Subdural hematoma along the anterior and posterior falx cerebri is relatively stable in appearance.  A 6 mm subdural hematoma is seen overlying the right frontoparietal region, relatively stable in appearance. There is persistent mild dilatation of the left lateral ventricle, raising concern for mild hydrocephalus.  There is no evidence of fracture; visualized osseous structures are unremarkable in appearance. The visualized portions of the orbits are within normal limits. The paranasal sinuses and mastoid air cells are well-aerated. No significant soft tissue abnormalities are seen.  IMPRESSION: 1. Increased leftward midline shift is noted, measuring  approximately 1.5 cm, compared to 1.1 cm on the prior study. As the patient's large intracranial hemorrhage has not increased significantly in size, differences in the amount of measured midline shift may largely reflect differences in positioning. 2. Large right parietal and occipital lobe intracranial hemorrhage is relatively stable in appearance. Underlying vasogenic edema is mildly more prominent. 3. Increased intracranial hemorrhage along the posterior aspect of the right temporal lobe, measuring 2.7 x 2.1 cm, with perhaps mildly worsened mass effect on the right side of the pons. 4. Stable subdural hematoma along the anterior and posterior falx cerebri, and along   the right frontoparietal region. Relatively stable mild hydrocephalus suspected.  These results were called by telephone at the time of interpretation on 08/02/2014 at 4:42 am to Nursing on [facility], who verbally acknowledged these results.   Electronically Signed   By: Jeffery  Chang M.D.   On: 08/02/2014 04:43   CT head: 1. 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 There is a large amount of surrounding edema and associated mass effect, with extensive right to left midline shift and evidence suggesting early transtentorial uncal herniation, as above   ASSESSMENT / PLAN:  PULMONARY OETT 8/9>>>8/14 Trach (df) 8/14>>> A: VDRF due to altered mental status due to ICH P:   For comfort care, will assess short runs of cpap 5ps 5, for 30 sec for morphine titrations Updated family  NEUROLOGIC A:  Large R ICH likely hypertensive bleed, etiology unclear, worsening status P:   For comfort , will start morphine Consider benzo drip also  Family updated  TODAY'S SUMMARY: for comfort care 2 pm  Daniel J. Feinstein, MD, FACP Pgr: [phone] [facility]   

## 2014-08-19 NOTE — Progress Notes (Signed)
Ativan 40ml wasted in sink. Morphine 80 ml wasted in sink. Witness by Barbarann EhlersJohn Combs RN.

## 2014-08-19 NOTE — Progress Notes (Signed)
Morphine & lorazepam gtts initiated at 12:55; all other comfort care measures initiated 10 14:00 per family request.  Ventilator stopped, pt w/ trach to room air.  Multiple family members & friends present w/ pt.

## 2014-08-19 NOTE — Progress Notes (Signed)
RT Note: Pt taken off vent per MD order. Pt appears comfortable at this time, RT will monitor.

## 2014-08-19 NOTE — Progress Notes (Signed)
Pt expired at 2106 with family at bedside. Death verified by myself and Barbarann EhlersJohn Combs RN. E Link (Dr Katrinka BlazingSmith) and CDS notified. Morphine and ativan drip stopped.

## 2014-08-19 NOTE — Progress Notes (Signed)
Stroke Team Progress Note  HISTORY Rachael Rivas is a 73 y.o. female with no known medical history who was taken to the emergency room at [facility] following acute onset headache and nausea and vomiting as well as left side weakness at 6 PM today. CT scan of the head showed large acute hemorrhagic infarction centered in the right parietal and occipital regions with associated subdural extension along the tentorium cerebelli and falx cerebri. Surrounding edema and mass effect were noted with a 10 mm right to left midline shift and early signs of trans-tentorial uncal herniation. Patient showed signs of rapid deterioration including increase in pupillary size of the right and reduced responsiveness as well as increased weakness of left extremities. Blood pressure was markedly elevated at 200/110. She was intubated and placed on mechanical ventilation as well as started on Cardene drip prior to transfer to [facility]. Neurosurgery was consulted and recommended no surgical intervention.  LSN: 6 PM on 07/28/2014  tPA Given: No: Acute ICH     She was admitted to the neuro ICU for further evaluation and treatment.  SUBJECTIVE No family members present this morning. The patient remains intubated, sedated, and unresponsive.  OBJECTIVE Most recent Vital Signs: Filed Vitals:   08/10/2014 0748 08/13/2014 0800 07/20/2014 0900 08/02/2014 0903  BP:  114/63 117/66 117/66  Pulse:  107 108 444  Temp: 101.2 F (38.4 C)     TempSrc: Axillary     Resp:  24 24 24   Height:      Weight:      SpO2:  97% 97% 97%   CBG (last 3)   Recent Labs  08/02/14 1204 08/02/14 1544 08/02/14 1928  GLUCAP 162* 167* 131*    IV Fluid Intake:   . niCARDipine Stopped (07/30/14 1137)  . propofol 30 mcg/kg/min (07/30/2014 0800)    MEDICATIONS  . antiseptic oral rinse  7 mL Mouth Rinse QID  . chlorhexidine  15 mL Mouth Rinse BID  . feeding supplement (PRO-STAT SUGAR FREE 64)  30 mL Per Tube Daily  . pantoprazole (PROTONIX) IV  40 mg  Intravenous QHS   PRN:  acetaminophen, fentaNYL, labetalol  Diet:     Tube feedings Activity:  Bedrest    DVT Prophylaxis: SCDs  CLINICALLY SIGNIFICANT STUDIES Basic Metabolic Panel:  Recent Labs Lab 07/28/14 0359  07/31/14 0310  08/01/14 0320  08/02/14 0900 08/02/14 1600  NA 145  < > 160*  < > 159*  < > 152* 151*  K 4.0  < > 3.2*  --  3.5*  --   --   --   CL 110  < > 124*  --  120*  --   --   --   CO2 21  < > 25  --  25  --   --   --   GLUCOSE 132*  < > 156*  --  119*  --   --   --   BUN 13  < > 29*  --  29*  --   --   --   CREATININE 0.66  < > 0.70  --  0.67  --   --   --   CALCIUM 8.6  < > 8.9  --  8.9  --   --   --   MG 2.0  --  2.2  --   --   --   --   --   PHOS 2.6  --  2.9  --   --   --   --   --   < > =  values in this interval not displayed. Liver Function Tests:   Recent Labs Lab 07/24/2014 1850 08/09/2014 2115  AST 27 31  ALT 15 17  ALKPHOS 119* 116  BILITOT 0.4 1.0  PROT 7.6 7.8  ALBUMIN 4.2 4.4   CBC:  Recent Labs Lab 07/28/2014 2115  07/31/14 0310 08/01/14 0320  WBC 24.4*  < > 12.1* 10.8*  NEUTROABS 21.0*  --  10.1*  --   HGB 14.0  < > 10.8* 11.8*  HCT 41.3  < > 33.2* 36.6  MCV 91.0  < > 92.5 94.1  PLT 192  < > 148* 164  < > = values in this interval not displayed. Coagulation:   Recent Labs Lab 08/08/2014 1850 08/01/14 0320  LABPROT 13.8 15.5*  INR 1.06 1.23   Cardiac Enzymes:   Recent Labs Lab 08/01/2014 2115 07/28/14 0200 07/28/14 0845  TROPONINI <0.30 <0.30 <0.30   Urinalysis:   Recent Labs Lab 08/01/14 1656  COLORURINE YELLOW  LABSPEC 1.027  PHURINE 5.5  GLUCOSEU 100*  HGBUR SMALL*  BILIRUBINUR NEGATIVE  KETONESUR NEGATIVE  PROTEINUR 30*  UROBILINOGEN 1.0  NITRITE POSITIVE*  LEUKOCYTESUR MODERATE*   Lipid Panel    Component Value Date/Time   CHOL 215* 07/26/2014 2115   TRIG 266* 07/30/2014 2055   HDL 50 08/07/2014 2115   CHOLHDL 4.3 07/26/2014 2115   VLDL 30 07/24/2014 2115   LDLCALC 135* 08/08/2014 2115   HgbA1C  Lab  Results  Component Value Date   HGBA1C 5.9* 08/05/2014    Urine Drug Screen:     Component Value Date/Time   LABOPIA NONE DETECTED 07/24/2014 2306   COCAINSCRNUR NONE DETECTED 07/20/2014 2306   LABBENZ POSITIVE* 08/02/2014 2306   AMPHETMU NONE DETECTED 08/05/2014 2306   THCU NONE DETECTED 08/17/2014 2306   LABBARB NONE DETECTED 07/20/2014 2306    Alcohol Level: No results found for this basename: ETH,  in the last 168 hours  Ct Head Wo Contrast 08/02/2014    1. Increased leftward midline shift is noted, measuring approximately 1.5 cm, compared to 1.1 cm on the prior study. As the patient's large intracranial hemorrhage has not increased significantly in size, differences in the amount of measured midline shift may largely reflect differences in positioning.  2. Large right parietal and occipital lobe intracranial hemorrhage is relatively stable in appearance. Underlying vasogenic edema is mildly more prominent.  3. Increased intracranial hemorrhage along the posterior aspect of the right temporal lobe, measuring 2.7 x 2.1 cm, with perhaps mildly worsened mass effect on the right side of the pons.  4. Stable subdural hematoma along the anterior and posterior falx cerebri, and along the right frontoparietal region. Relatively stable mild hydrocephalus suspected.     Chest Portable 1 View To Assess Tube Placement And Rule-out Pneumothorax 08/01/2014    New tracheostomy tube in appropriate position.  No active disease.      Dg Abd Portable 1v 08/01/2014    Feeding tube tip at the gastric antrum. Advance 5 cm to allow for post pyloric transit.        MRI / MRAof the brain  07/29/2014 1. 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  2. Separate, punctate, acute infarcts in the right corona radiata, right frontal operculum, and left parietal operculum.  3. Small amount right-sided subdural  hematoma.  4. No evidence of major intracranial arterial occlusion. Moderate right M2 branch vessel irregularity and attenuation.  5. Mild to moderate proximal left PCA stenosis.  6. Bulbous appearance of the anterior communicating artery.    Carotid Doppler 1-39% bilateral stenosis  2D Echocardiogram  Left ventricle: The cavity size was normal. Wall thickness was normal. Systolic function was normal. The estimated ejection fraction was in the range of 55% to 60%. Wall motion was normal; there were no regional wall motion abnormalities. Doppler parameters are consistent with abnormal left ventricular relaxation (grade 1 diastolic dysfunction).   CXR  No radiographic evidence of acute cardiopulmonary disease.      EKG    Sinus tachycardia Nonspecific ST abnormality  Therapy Recommendations pending  Physical Exam  Frail elderly caucasian lady not in distress..Intubated. Afebrile. Head is nontraumatic. Neck is supple without bruit.   Cardiac exam no murmur or gallop. Lungs are clear to auscultation. Distal pulses are well felt. Neurological Exam ;  Stuporose.. Not following any commands.Pupils are 2 mm and sluggish. Corneal reflexes are present. Dolls eye movements are sluggish. Fundi were not visualized. No obvious facial weakness. Tongue midline. Moves right side spontaneously and semi-purposefully against gravity. No movement noted on the left to painful stimuli.  No decerebrate posturing .Tone is decreased on the left. Left plantar is upgoing right is downgoing.  ASSESSMENT Ms. Rachael Rivas is a 73 y.o. female presenting with sudden onset of mental status changes with nausea vomiting. Imaging confirms a right parieto-occipital large intracerebral hematoma with surrounding cytotoxic edema and 17mm right-to-left midline shift and cerebral herniation. There is also associated tentorial subdural and right convexity subdural hematoma. Etiology of the hemorrhage is indeterminate  differential includes hypertensive  (given adm BP 200/110) versus amyloid angiopathy. Hemorrhagic infarct or metastasis would be less .    On no antiplatelets/anticoagulants prior to admission. And there is no h/o of trauma or fall. Patient with resultant comatose state with left hemiparesis  .   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 Not on statins prior to admission  Respiratory failure secondary to cerebral edema from large brain hemorrhage  Induced Hypernatremia to control cerebral edema   Hospital day # 7  TREATMENT/PLAN Continue ICU level care with aggressive blood pressure control and keep SBP below   180/100.  Keep normothermic, normoglycemic and euvolemic. Hold hypertonic saline as serum sodium above goal   DVT and GI prophylaxis. Continue tube feeds for nutrition. Keep I =O Continue Lovenox for DVT prophylaxis Prognosis is poor Long discussion with husband and brother-in-law regarding her neurological condition, prognosis and answered questions.  Status post tracheostomy UTI - on Rocephin Per Dr. Jane Canary note family has requested terminal wean for today at 2 PM.   Delton See PA-C [facility] Pager [phone] 2014/08/24, 9:12 AM   After significant discussions with family yesterday, seeing imaging, pt's family decided to take pt of vent.   This patient is ill and at significant risk of neurological worsening, constant monitoring of vital signs, hemodynamics,respiratory and cardiac monitoring,review of multiple databases, neurological assessment, discussion with family, other specialists and medical decision making of high complexity.I have made any additions or clarifications directly to the above note.  Pauletta Browns      To contact Stroke Continuity provider, please refer to [email or web address]. After hours, contact General Neurology

## 2014-08-19 DEATH — deceased

## 2014-08-22 NOTE — Discharge Summary (Signed)
Rachael Rivas, HISER NO.:  [id number]  MEDICAL RECORD NO.:  [id number]  LOCATION:                                 FACILITY:  PHYSICIAN:  Raylene Miyamoto, MD DATE OF BIRTH:  11/28/1941  DATE OF ADMISSION:  08/10/2014 DATE OF DISCHARGE:  Aug 26, 2014                              DISCHARGE SUMMARY   DEATH SUMMARY  This is a 73 year old female admitted with a large hemorrhagic infarct involving the right temporal and parietal lobes with associated moderate edema with midline shift with some punctate and acute infarcts in the right corona radiata.  She was intubated on July 27, 2014 secondary to her neurologic events.  She had a __________.  Neurology consultation July 28, 2014, who met with __________ DNR status.  On July 29, 2014, she was continued on 3% saline.  On July 30, 2014, she continued making no progress neurologically.  On August 01, 2014, __________, tracheostomy was placed.  __________ next 48 hours, she continued to decline neurologically with concerns of worsening posturing.  CAT scan of the head was done, which showed worsening shift.  Comfort care was provided __________ patient expired.  FINAL DIAGNOSES UPON DEATH: 1. Massive intracranial hemorrhage. 2. Brain edema secondary to #1. 3. Acute respiratory failure.     Raylene Miyamoto, MD     DJF/MEDQ  D:  08/20/2014  T:  08/21/2014  Job:  [id number]

## 2015-01-31 IMAGING — CT CT HEAD W/O CM
1 series · 14 of 30 positions shown, 18 images · non-contrast
Comparison: CT of the head performed 07/31/2012

CLINICAL DATA: Follow-up intracranial hemorrhage.

EXAM:
CT HEAD WITHOUT CONTRAST
TECHNIQUE: Contiguous axial images were obtained from the base of the skull
through the vertex without intravenous contrast.

[Series 2: head 5.0 h30s · axial · 0.43mm/px · z∈[-136,-1]mm · 14 of 32 slices shown, 18 images]
[im 3/32  brain]
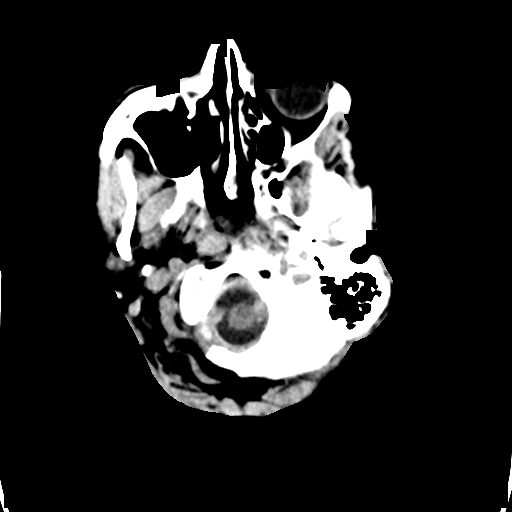
[im 3/32  bone]
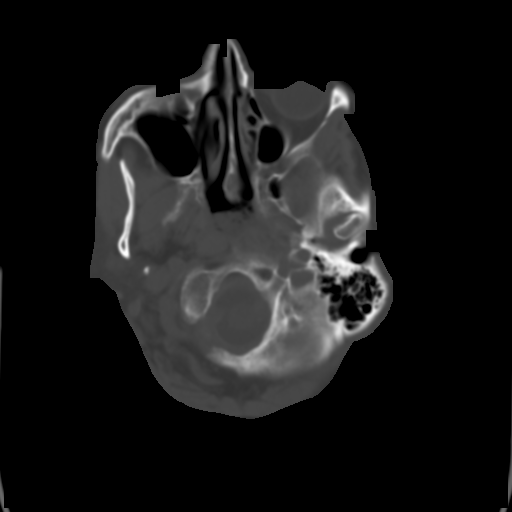
[im 5/32  brain]
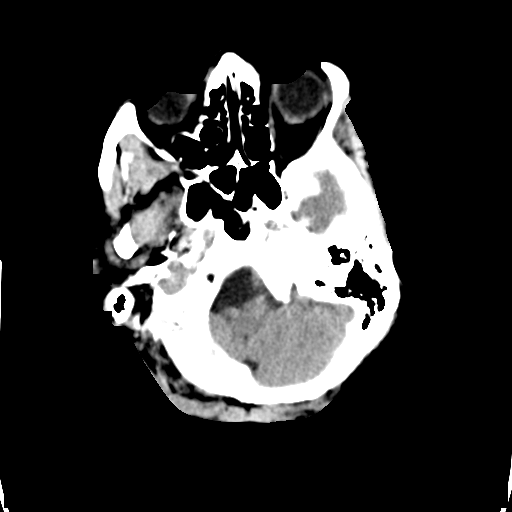
[im 7/32  brain]
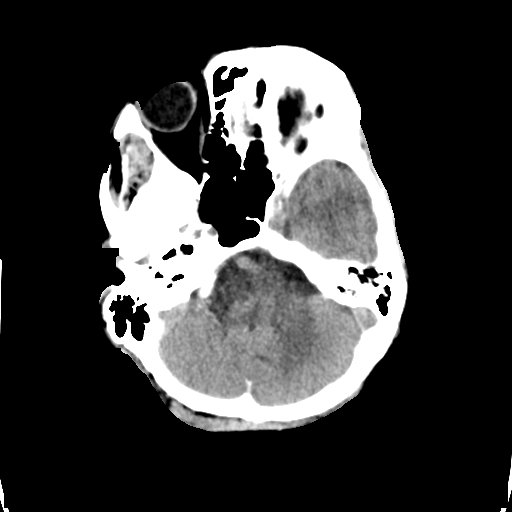
[im 9/32  brain]
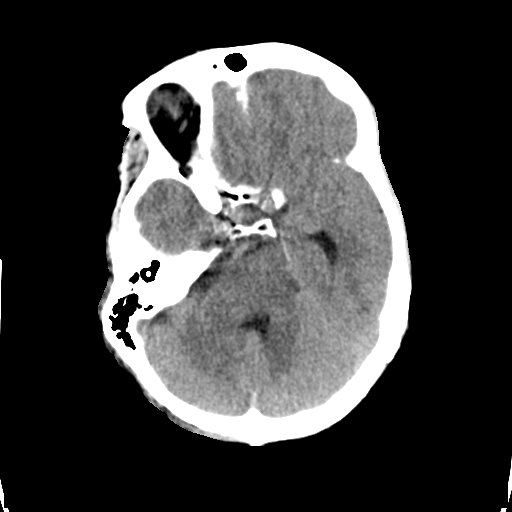
[im 11/32  brain]
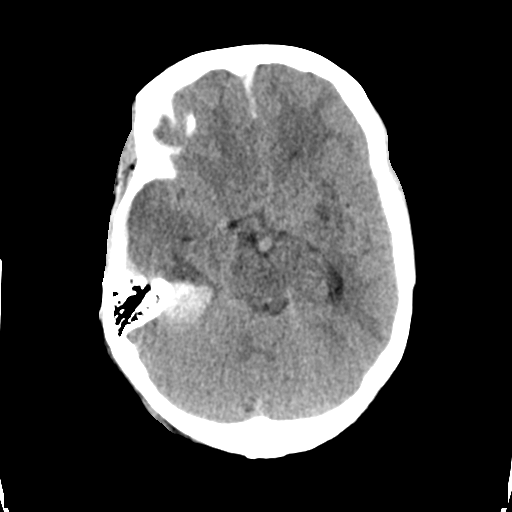
[im 11/32  bone]
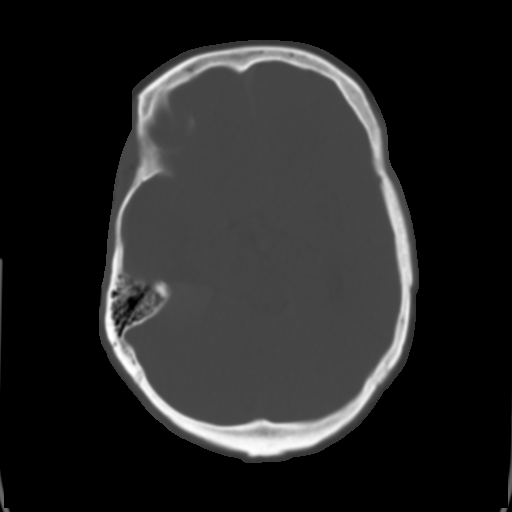
[im 13/32  brain]
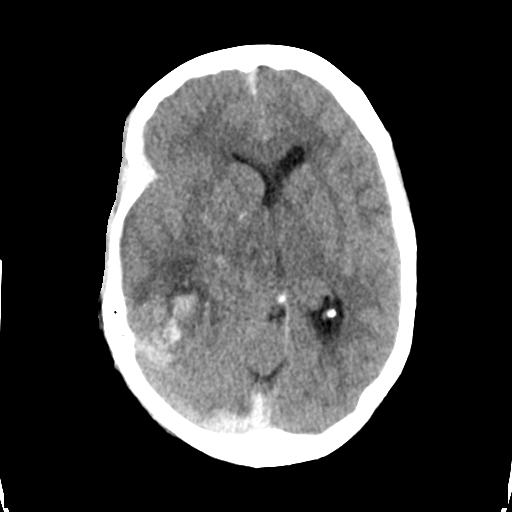
[im 15/32  brain]
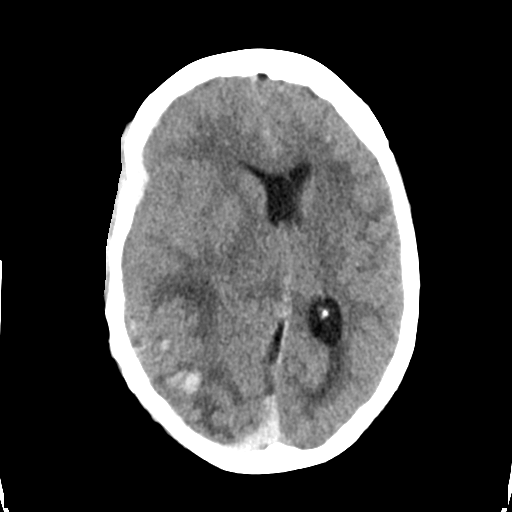
[im 18/32  brain]
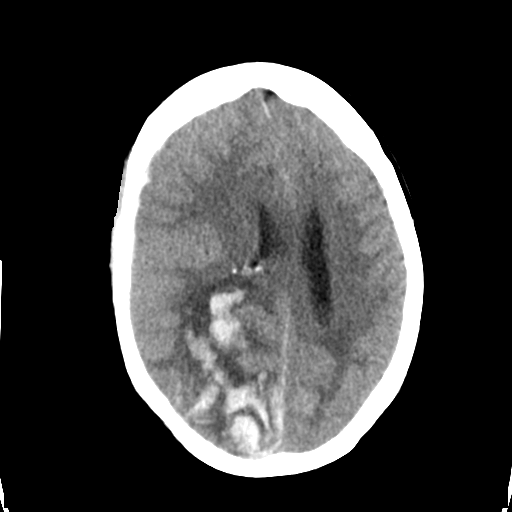
[im 20/32  brain]
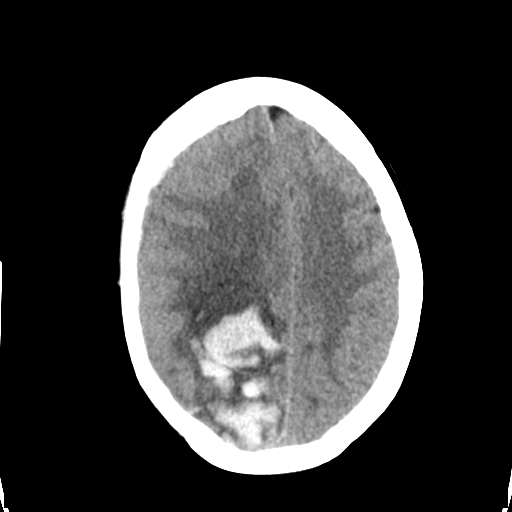
[im 20/32  bone]
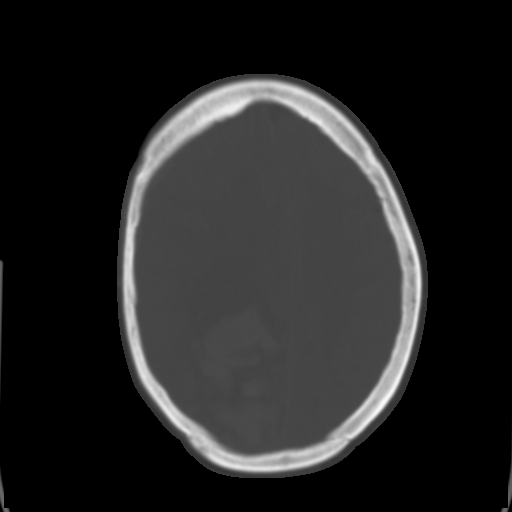
[im 22/32  brain]
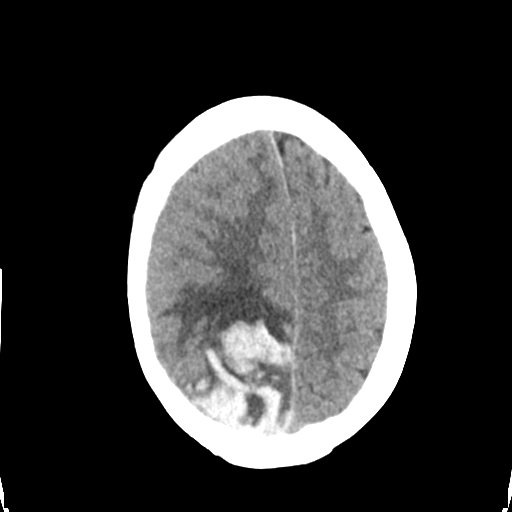
[im 24/32  brain]
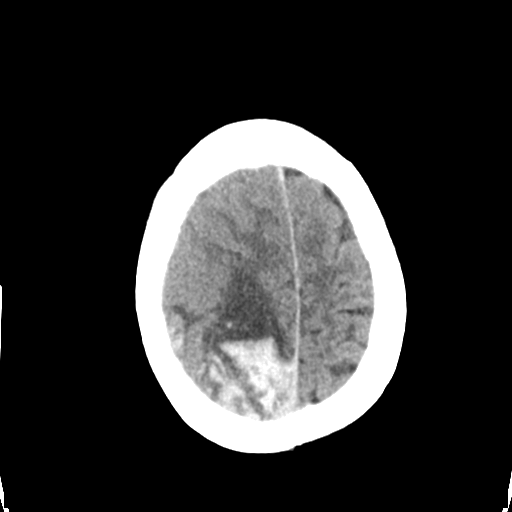
[im 26/32  brain]
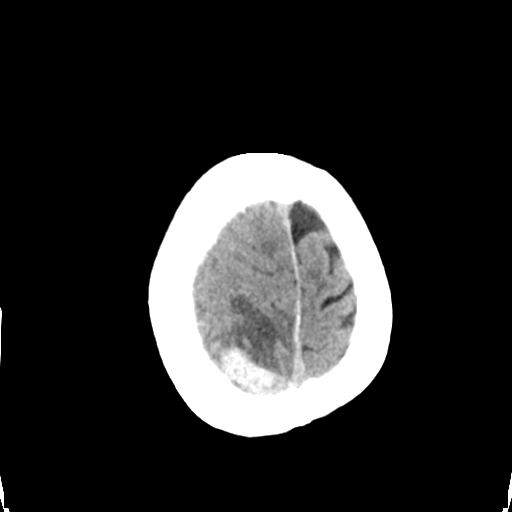
[im 28/32  brain]
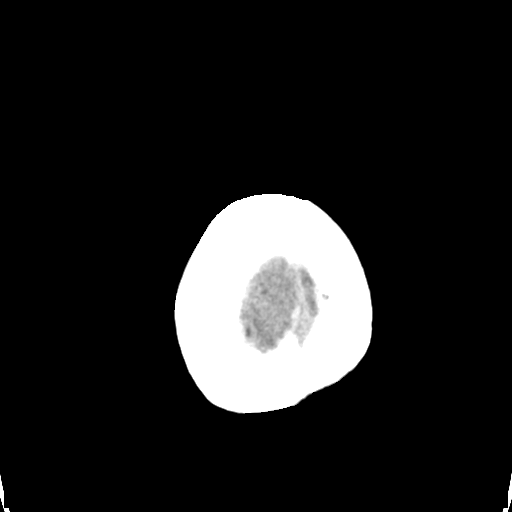
[im 28/32  bone]
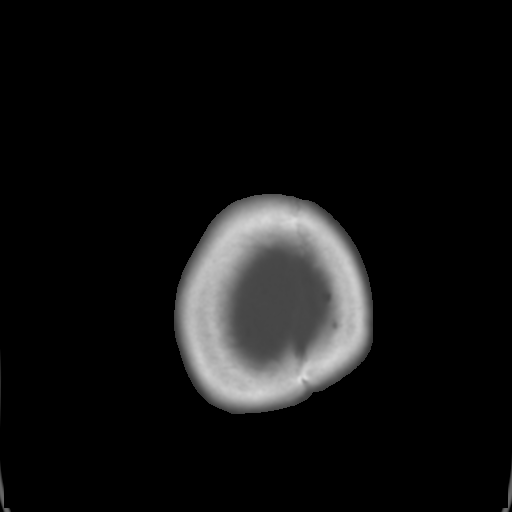
[im 30/32  brain]
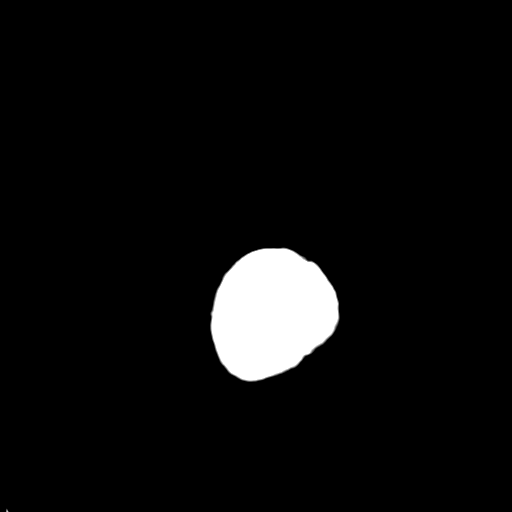

[14 of 30 positions shown; findings below may reference images not displayed]

FINDINGS: The large intracranial hemorrhage at the right parietal and
occipital lobes is grossly stable from the prior study, measuring
approximately 7.2 x 4.2 cm. Underlying vasogenic edema may be mildly
more prominent, as there is approximately 1.5 cm of leftward midline
shift on the current study.

Given that the hemorrhage has not increased significantly in size,
differences in the amount of measured midline shift may reflect
differences in positioning.

There is increased intracranial hemorrhage along the posterior
aspect of the right temporal lobe, measuring approximately 2.7 x
cm. There is perhaps mildly worsened mass effect on the right side
of the pons. Subdural hematoma along the anterior and posterior falx
cerebri is relatively stable in appearance.

A 6 mm subdural hematoma is seen overlying the right frontoparietal
region, relatively stable in appearance. There is persistent mild
dilatation of the left lateral ventricle, raising concern for mild
hydrocephalus.

There is no evidence of fracture; visualized osseous structures are
unremarkable in appearance. The visualized portions of the orbits
are within normal limits. The paranasal sinuses and mastoid air
cells are well-aerated. No significant soft tissue abnormalities are
seen.
IMPRESSION: 1. Increased leftward midline shift is noted, measuring
approximately 1.5 cm, compared to 1.1 cm on the prior study. As the
patient's large intracranial hemorrhage has not increased
significantly in size, differences in the amount of measured midline
shift may largely reflect differences in positioning.
2. Large right parietal and occipital lobe intracranial hemorrhage
is relatively stable in appearance. Underlying vasogenic edema is
mildly more prominent.
3. Increased intracranial hemorrhage along the posterior aspect of
the right temporal lobe, measuring 2.7 x 2.1 cm, with perhaps mildly
worsened mass effect on the right side of the pons.
4. Stable subdural hematoma along the anterior and posterior falx
cerebri, and along the right frontoparietal region. Relatively
stable mild hydrocephalus suspected.

These results were called by telephone at the time of interpretation
on 08/02/2014 at [DATE] to Nursing on [facility], who verbally
acknowledged these results.
